# Patient Record
Sex: Male | Born: 1960 | Race: Asian | Hispanic: No | Marital: Married | Smoking: Current every day smoker
Health system: Southern US, Community
[De-identification: ages and names within clinical notes are randomized; demographics above are authoritative.]

## PROBLEM LIST (undated history)

## (undated) DIAGNOSIS — K649 Unspecified hemorrhoids: Secondary | ICD-10-CM

## (undated) DIAGNOSIS — E785 Hyperlipidemia, unspecified: Secondary | ICD-10-CM

## (undated) DIAGNOSIS — K219 Gastro-esophageal reflux disease without esophagitis: Secondary | ICD-10-CM

## (undated) HISTORY — DX: Unspecified hemorrhoids: K64.9

## (undated) HISTORY — DX: Hyperlipidemia, unspecified: E78.5

## (undated) HISTORY — DX: Gastro-esophageal reflux disease without esophagitis: K21.9

---

## 2005-09-25 ENCOUNTER — Emergency Department (HOSPITAL_COMMUNITY): Admission: EM | Admit: 2005-09-25 | Discharge: 2005-09-25 | Payer: Self-pay | Admitting: Emergency Medicine

## 2011-06-24 IMAGING — CR DG HAND COMPLETE 3+V*L*
3 series · 3 of 3 positions shown · non-contrast
Comparison: None.

CLINICAL DATA: Pain of the long finger.  No known injury.

LEFT HAND - COMPLETE 3+ VIEW

[PA]
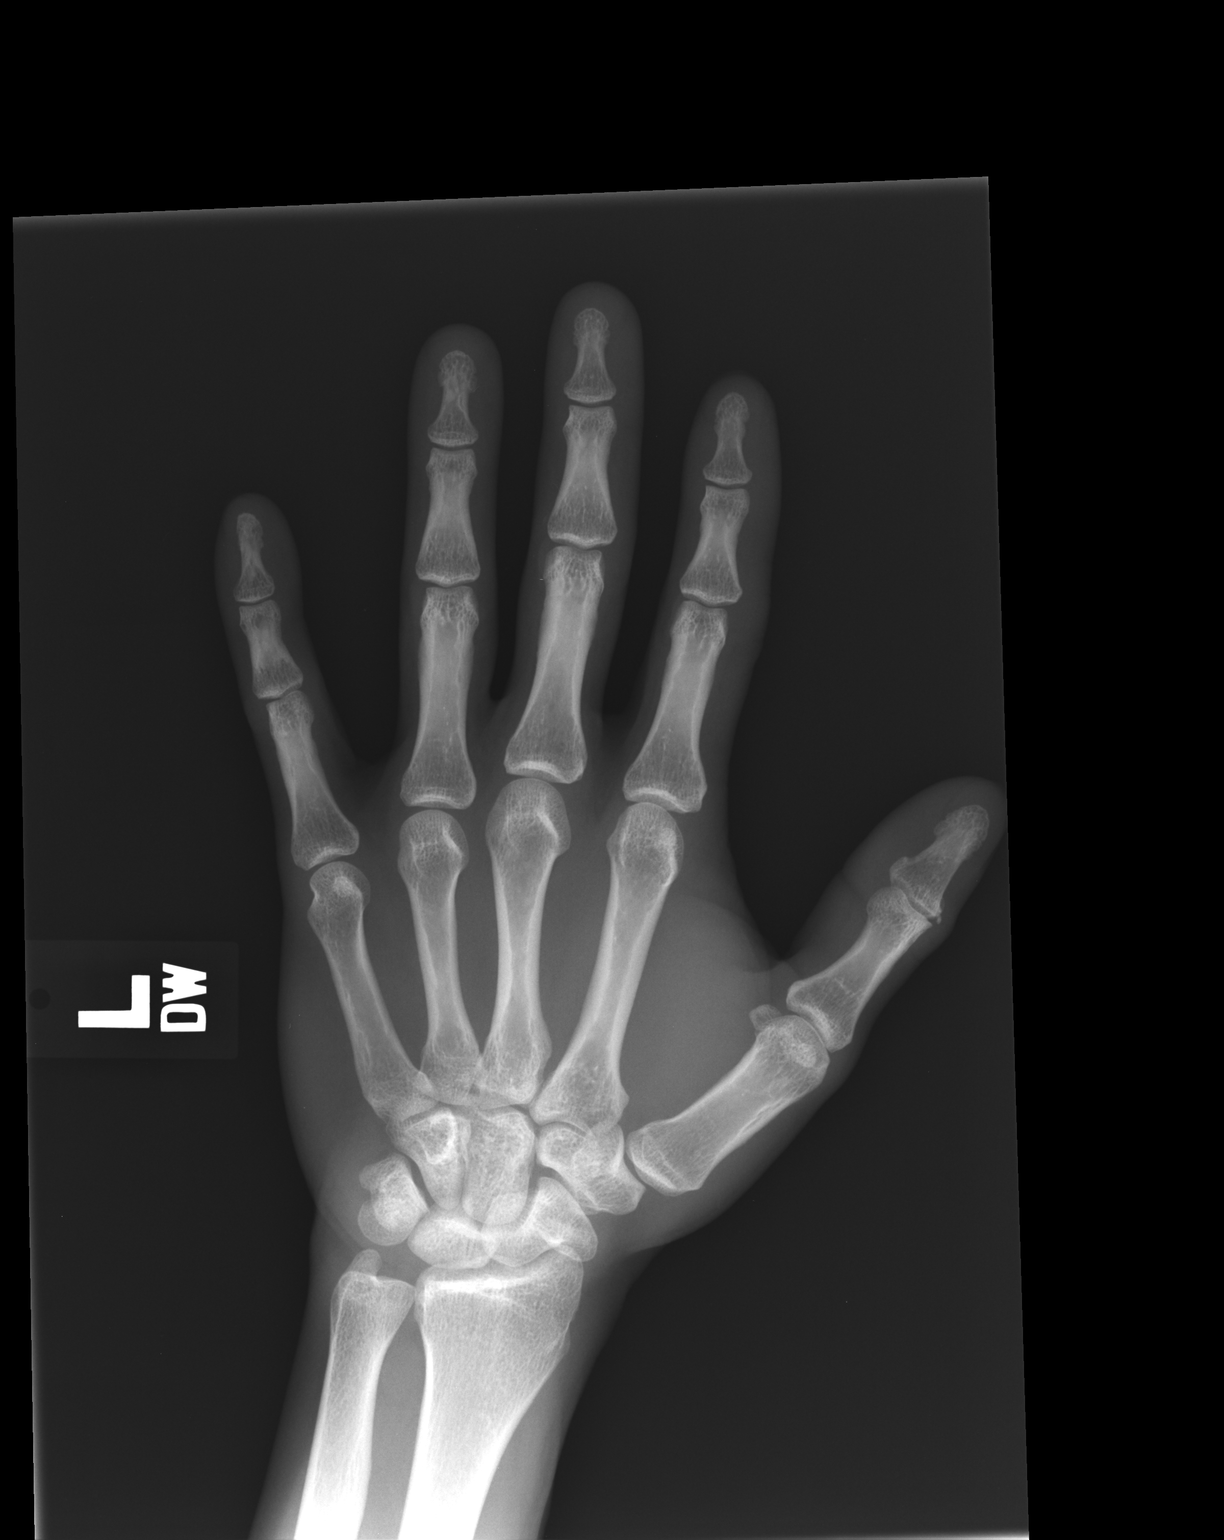

[lateral]
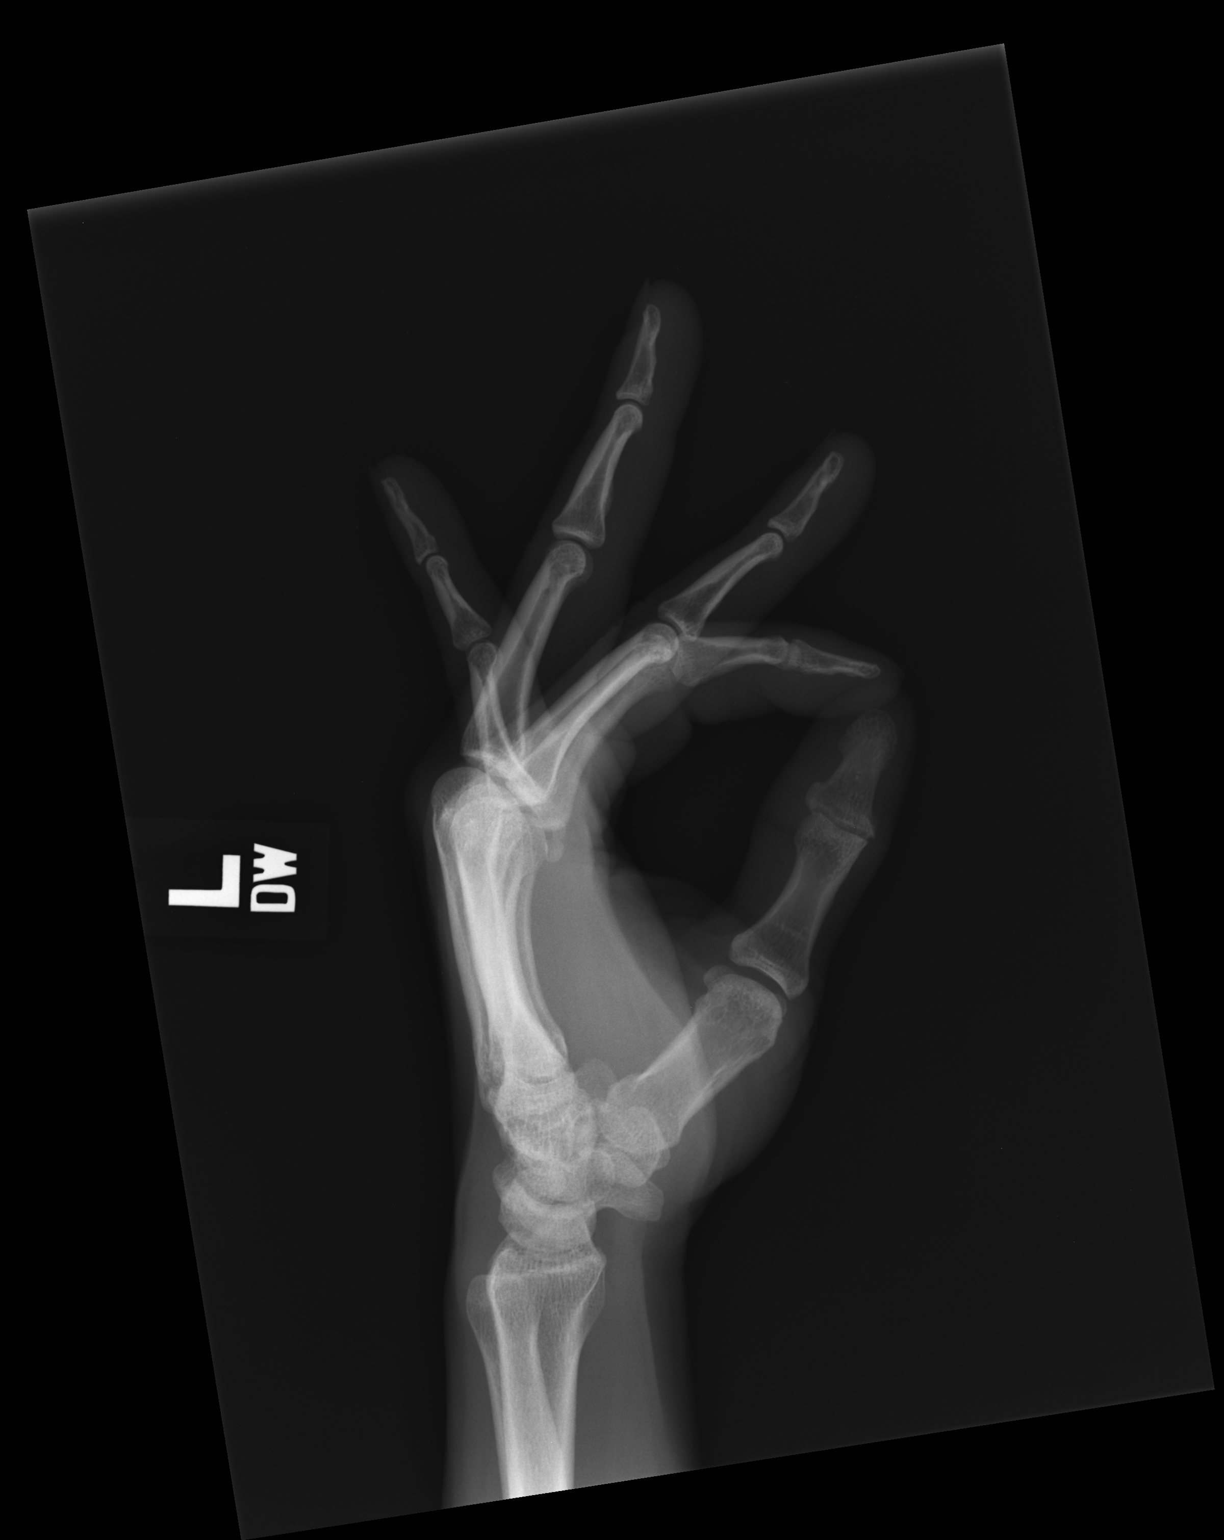

[pa obl]
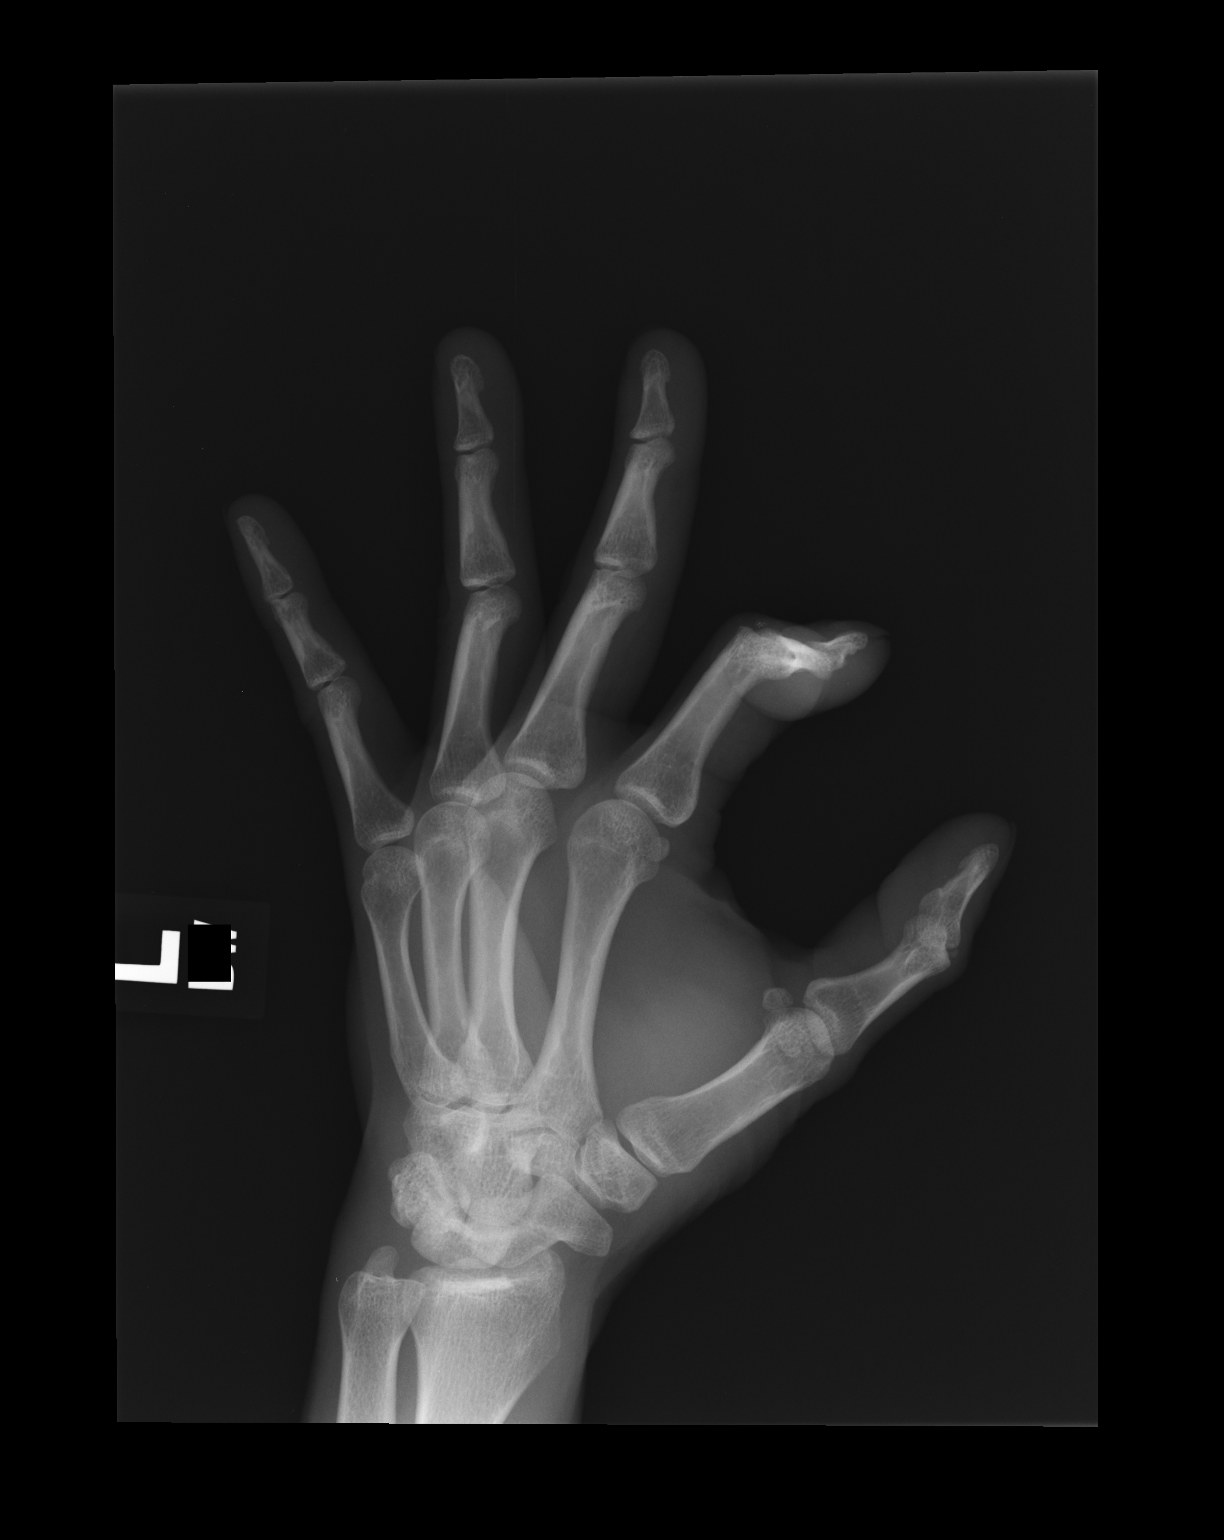

[3 of 3 positions shown; findings below may reference images not displayed]

FINDINGS: Imaged bones, joints and soft tissues appear normal.
IMPRESSION: Negative exam.

Clinically significant discrepancy from primary report, if
provided: None

## 2011-08-29 ENCOUNTER — Ambulatory Visit: Payer: Self-pay | Admitting: Internal Medicine

## 2011-08-29 ENCOUNTER — Ambulatory Visit: Payer: Self-pay

## 2011-08-29 VITALS — BP 103/67 | HR 93 | Temp 97.9°F | Resp 16 | Ht 61.5 in | Wt 104.2 lb

## 2011-08-29 DIAGNOSIS — S6990XA Unspecified injury of unspecified wrist, hand and finger(s), initial encounter: Secondary | ICD-10-CM

## 2011-08-29 DIAGNOSIS — S66819A Strain of other specified muscles, fascia and tendons at wrist and hand level, unspecified hand, initial encounter: Secondary | ICD-10-CM

## 2011-08-29 DIAGNOSIS — S60519A Abrasion of unspecified hand, initial encounter: Secondary | ICD-10-CM

## 2011-08-29 DIAGNOSIS — M6530 Trigger finger, unspecified finger: Secondary | ICD-10-CM

## 2011-08-29 DIAGNOSIS — M79609 Pain in unspecified limb: Secondary | ICD-10-CM

## 2011-08-29 MED ORDER — TRAMADOL HCL 50 MG PO TABS: 50.0000 mg | ORAL_TABLET | Freq: Three times a day (TID) | ORAL | Status: AC | PRN

## 2011-08-29 NOTE — Progress Notes (Signed)
  Subjective:    Patient ID: Jack Moss, male    DOB: 1960-05-22, 51 y.o.   MRN: [id number]  HPI Pain and deformity of the 3rd finger of the left hand. Pain 8/10. Noticed 3 or 4 weeks ago that he had trouble extending the 3rd digit of the left hand after he flexes that finger.  Pain in the hand and in the finger.  No trauma.  Works in a [employer].    Review of Systems  Constitutional: Negative.   HENT: Negative.   Eyes: Negative.   Respiratory: Negative.   Cardiovascular: Negative.   Gastrointestinal: Negative.   Genitourinary: Negative.   Musculoskeletal: Negative.        Pain 3rd digit in the left hand  Skin: Negative.   Neurological: Negative.   Hematological: Negative.   Psychiatric/Behavioral: Negative.   All other systems reviewed and are negative.       Objective:   Physical Exam  Constitutional: He is oriented to person, place, and time. He appears well-developed and well-nourished.       Wife is interpretor  HENT:  Head: Normocephalic and atraumatic.  Eyes: Conjunctivae and EOM are normal. Pupils are equal, round, and reactive to light.  Neck: Normal range of motion. Neck supple.  Musculoskeletal:       3rd digit is held in flexion. No passive extension. Able to extend on active extension. Tender on the palmer surface at the flexor tendon area.  Neurological: He is alert and oriented to person, place, and time.  Skin: Skin is warm and dry.  Psychiatric: He has a normal mood and affect. His behavior is normal. Judgment and thought content normal.     [facility] reading (PRIMARY) by  Dr. Mindi Junker.  negitive hand fracture    Assessment & Plan:  Trigger finger Flexor tendon injury Will put on nsai and put in splint and refer to  Hand surgery.

## 2011-08-31 ENCOUNTER — Telehealth: Payer: Self-pay

## 2020-11-25 ENCOUNTER — Encounter: Payer: Self-pay | Admitting: Family Medicine

## 2020-11-25 ENCOUNTER — Emergency Department (HOSPITAL_COMMUNITY): Payer: 59

## 2020-11-25 ENCOUNTER — Emergency Department (HOSPITAL_COMMUNITY)
Admission: EM | Admit: 2020-11-25 | Discharge: 2020-11-25 | Disposition: A | Discharge status: Home / Self Care | Payer: 59 | Attending: Emergency Medicine | Admitting: Emergency Medicine

## 2020-11-25 ENCOUNTER — Encounter (HOSPITAL_COMMUNITY): Payer: Self-pay | Admitting: Emergency Medicine

## 2020-11-25 ENCOUNTER — Ambulatory Visit (INDEPENDENT_AMBULATORY_CARE_PROVIDER_SITE_OTHER): Payer: 59 | Admitting: Family Medicine

## 2020-11-25 ENCOUNTER — Other Ambulatory Visit: Payer: Self-pay

## 2020-11-25 VITALS — BP 100/58 | HR 75 | Ht 62.0 in | Wt 113.0 lb

## 2020-11-25 DIAGNOSIS — R0789 Other chest pain: Secondary | ICD-10-CM | POA: Diagnosis present

## 2020-11-25 DIAGNOSIS — K8689 Other specified diseases of pancreas: Secondary | ICD-10-CM | POA: Diagnosis not present

## 2020-11-25 DIAGNOSIS — R1084 Generalized abdominal pain: Secondary | ICD-10-CM | POA: Insufficient documentation

## 2020-11-25 DIAGNOSIS — R079 Chest pain, unspecified: Secondary | ICD-10-CM | POA: Insufficient documentation

## 2020-11-25 DIAGNOSIS — F17200 Nicotine dependence, unspecified, uncomplicated: Secondary | ICD-10-CM | POA: Diagnosis not present

## 2020-11-25 DIAGNOSIS — L819 Disorder of pigmentation, unspecified: Secondary | ICD-10-CM | POA: Insufficient documentation

## 2020-11-25 LAB — CBC
HCT: 40.1 % (ref 39.0–52.0)
Hemoglobin: 13.5 g/dL (ref 13.0–17.0)
MCH: 31.1 pg (ref 26.0–34.0)
MCHC: 33.7 g/dL (ref 30.0–36.0)
MCV: 92.4 fL (ref 80.0–100.0)
Platelets: 345 10*3/uL (ref 150–400)
RBC: 4.34 MIL/uL (ref 4.22–5.81)
RDW: 13.2 % (ref 11.5–15.5)
WBC: 5.5 10*3/uL (ref 4.0–10.5)
nRBC: 0 % (ref 0.0–0.2)

## 2020-11-25 LAB — COMPREHENSIVE METABOLIC PANEL
ALT: 31 U/L (ref 0–44)
AST: 26 U/L (ref 15–41)
Albumin: 3.8 g/dL (ref 3.5–5.0)
Alkaline Phosphatase: 41 U/L (ref 38–126)
Anion gap: 6 (ref 5–15)
BUN: 15 mg/dL (ref 6–20)
CO2: 25 mmol/L (ref 22–32)
Calcium: 9.2 mg/dL (ref 8.9–10.3)
Chloride: 107 mmol/L (ref 98–111)
Creatinine, Ser: 0.9 mg/dL (ref 0.61–1.24)
GFR, Estimated: >60 mL/min (ref >60)
Glucose, Bld: 105 mg/dL — ABNORMAL HIGH (ref 70–99)
Potassium: 3.9 mmol/L (ref 3.5–5.1)
Sodium: 138 mmol/L (ref 135–145)
Total Bilirubin: 0.2 mg/dL — ABNORMAL LOW (ref 0.3–1.2)
Total Protein: 6.9 g/dL (ref 6.5–8.1)

## 2020-11-25 LAB — TROPONIN I (HIGH SENSITIVITY)
Troponin I (High Sensitivity): 3 ng/L (ref <18)
Troponin I (High Sensitivity): 3 ng/L (ref <18)

## 2020-11-25 LAB — URINALYSIS, ROUTINE W REFLEX MICROSCOPIC
Bilirubin Urine: NEGATIVE
Glucose, UA: NEGATIVE mg/dL
Hgb urine dipstick: NEGATIVE
Ketones, ur: NEGATIVE mg/dL
Leukocytes,Ua: NEGATIVE
Nitrite: NEGATIVE
Protein, ur: NEGATIVE mg/dL
Specific Gravity, Urine: >1.03 — ABNORMAL HIGH (ref 1.005–1.030)
pH: 6 (ref 5.0–8.0)

## 2020-11-25 LAB — LIPASE, BLOOD: Lipase: 56 U/L — ABNORMAL HIGH (ref 11–51)

## 2020-11-25 IMAGING — CT CT ABD-PELV W/ CM
2 of 7 series · 15 of 46 positions shown, 17 images · IV contrast (APPLIED)
Comparison: None

CLINICAL DATA: Cardiologist up JOSHJAX 0 CT coil thanks MR
given how you I am okay IF add my email off for the

EXAM:
CT ABDOMEN AND PELVIS WITH CONTRAST
TECHNIQUE: Multidetector CT imaging of the abdomen and pelvis was performed
using the standard protocol following bolus administration of
intravenous contrast.
CONTRAST:  100mL OMNIPAQUE IOHEXOL 300 MG/ML  SOLN

[Series 6: coronal soft tissue · coronal · 0.62mm/px · 3 of 79 slices shown]
[im 27/79  soft-tissue]
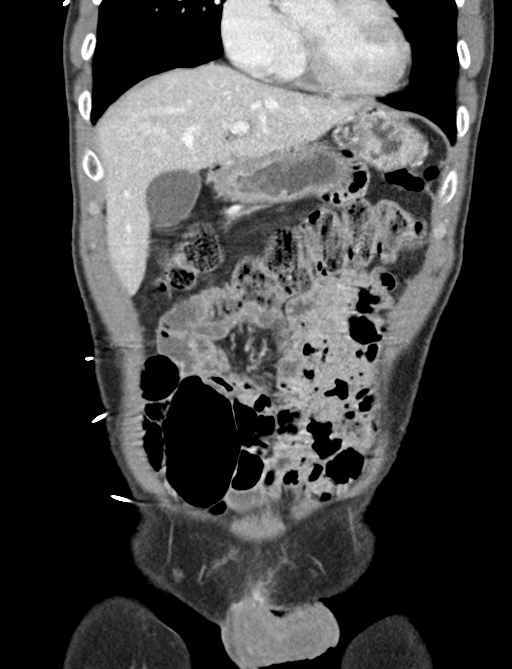
[im 35/79  soft-tissue]
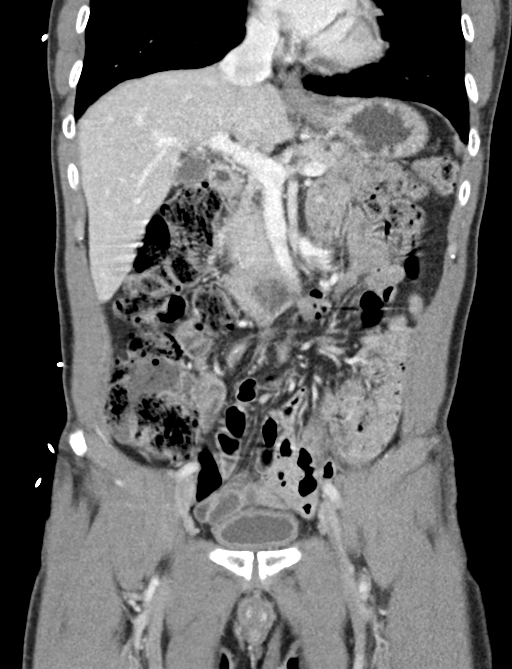
[im 44/79  soft-tissue]
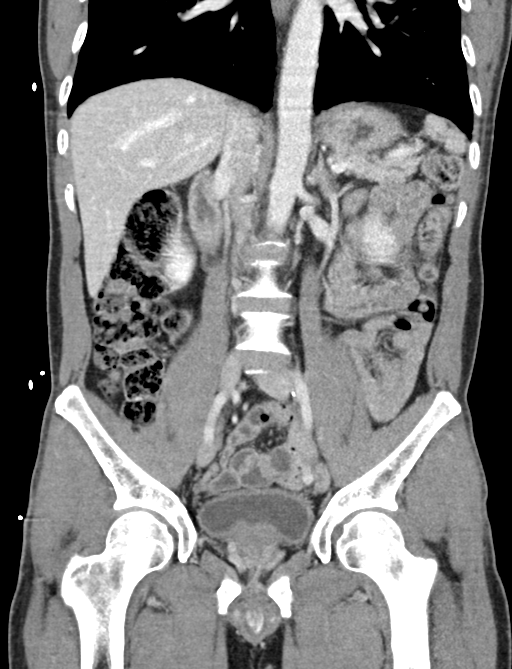

[Series 9: abd/ pelvis 1.0 i30f 2 · axial · 0.64mm/px · z∈[+825,+1203]mm · 12 of 414 slices shown, 14 images]
[im 18/414  soft-tissue]
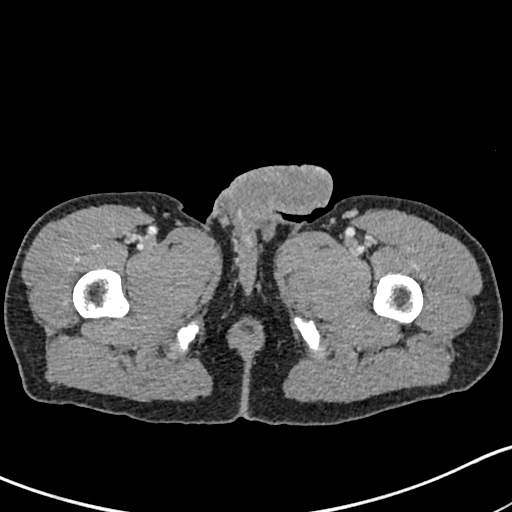
[im 18/414  bone]
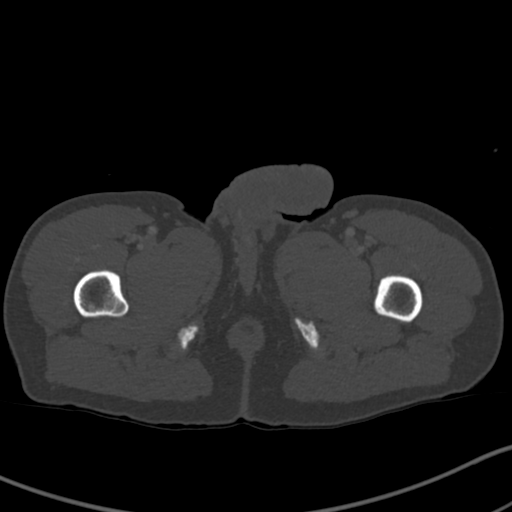
[im 54/414  soft-tissue]
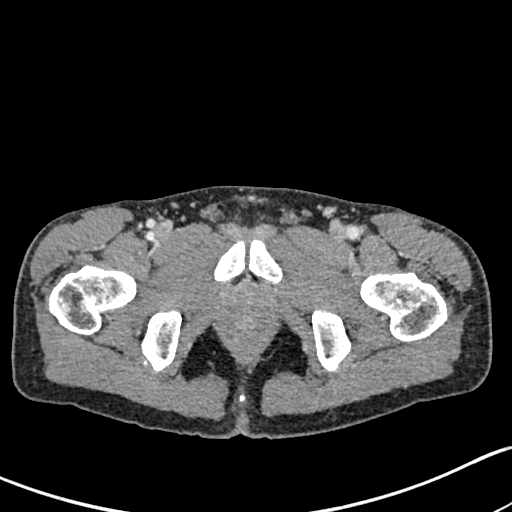
[im 90/414  soft-tissue]
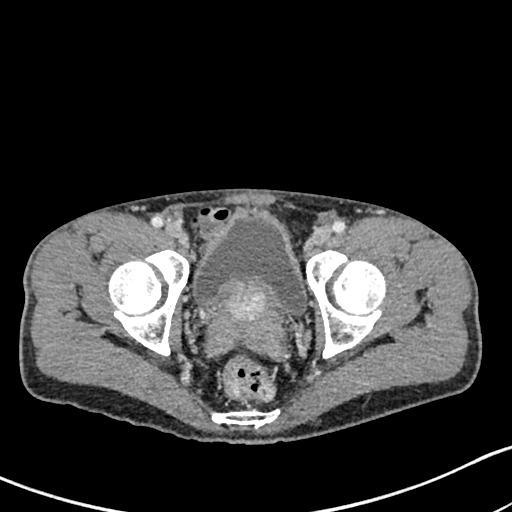
[im 126/414  soft-tissue]
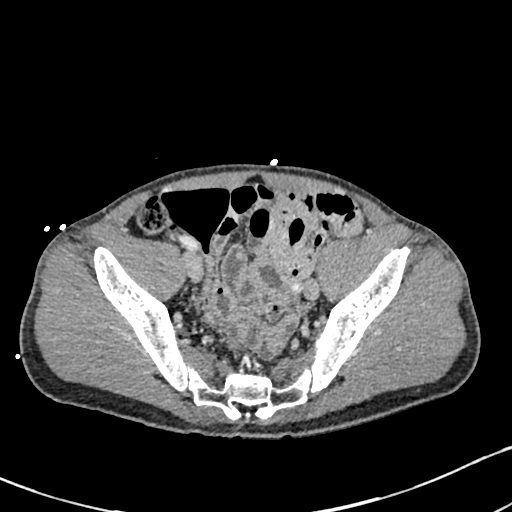
[im 162/414  soft-tissue]
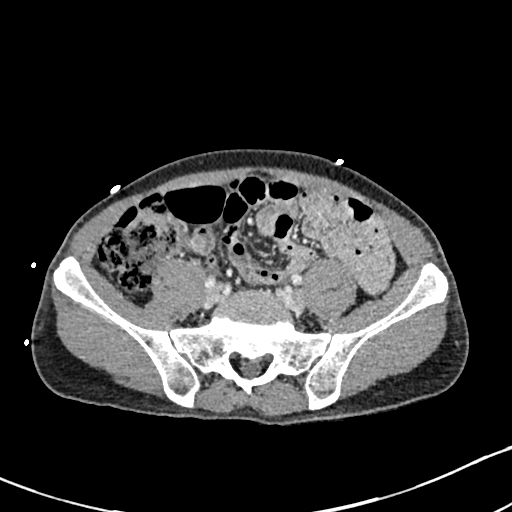
[im 198/414  soft-tissue]
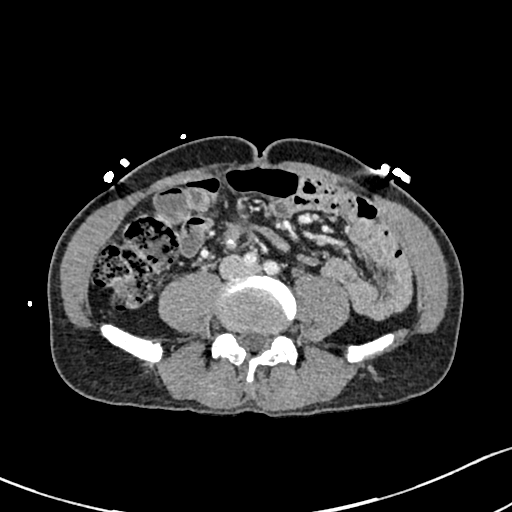
[im 216/414  soft-tissue]
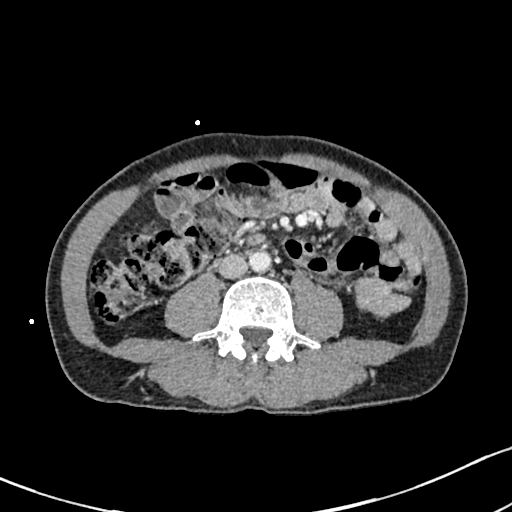
[im 252/414  soft-tissue]
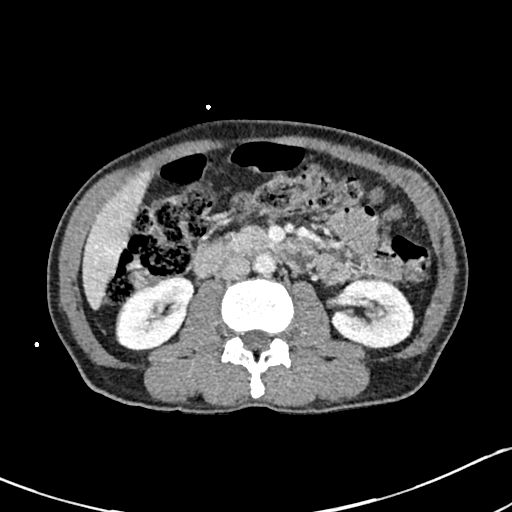
[im 288/414  soft-tissue]
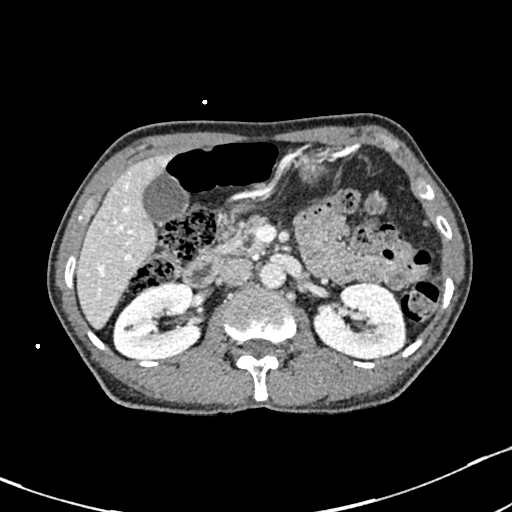
[im 288/414  bone]
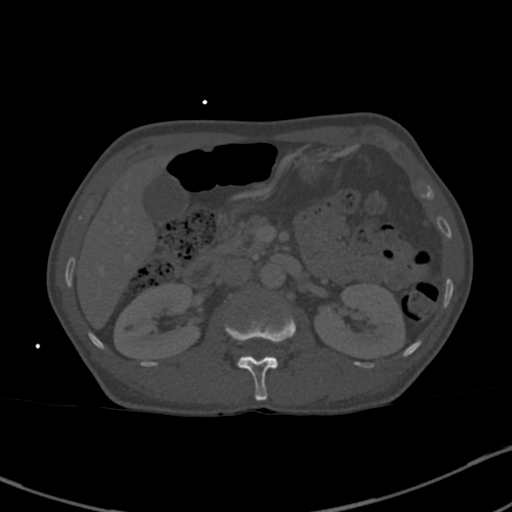
[im 324/414  soft-tissue]
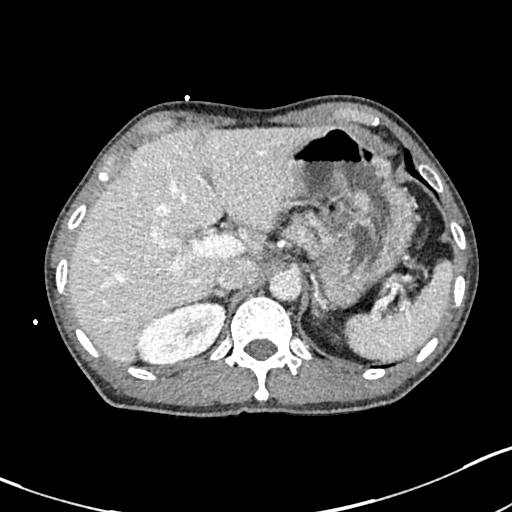
[im 360/414  soft-tissue]
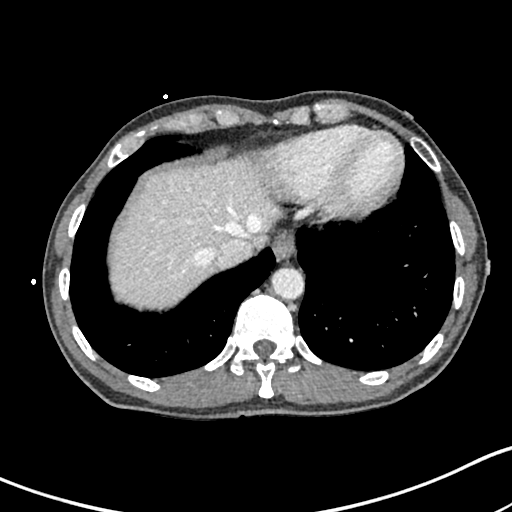
[im 396/414  soft-tissue]
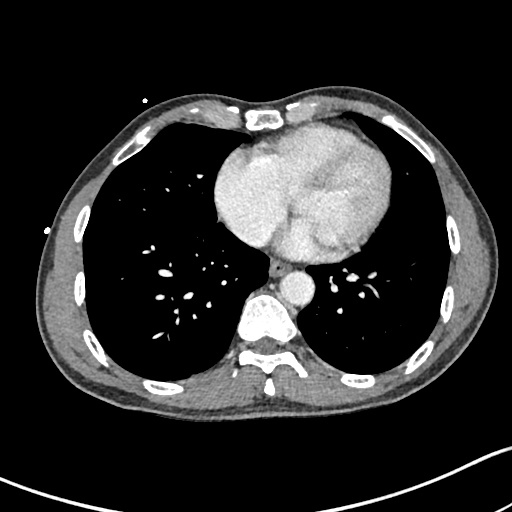

[15 of 46 positions shown; findings below may reference images not displayed]

FINDINGS: Lower chest: Unremarkable.

Hepatobiliary: Tiny hypodensity in the dome of the RIGHT hemi liver
and in the RIGHT hemi liver in hepatic subsegment VI likely small
cyst. Portal vein is patent. Hepatic veins are patent. No
pericholecystic stranding. No biliary duct dilation.

Pancreas: Signs of isolated pancreatic ductal dilation without
visible lesion. Main duct up to 5 mm caliber. No substantial atrophy
of peripheral pancreas. No signs of active inflammation at this
time.

Spleen: Normal size and contour.

Adrenals/Urinary Tract: Adrenal glands are normal.

Symmetric renal enhancement. No hydronephrosis. No nephrolithiasis.
Small renal lesions likely small cyst too small for definitive
characterization. Urinary bladder with smooth contours.

No perivesical stranding.

Stomach/Bowel: Normal appendix. No sign of small bowel obstruction
or acute small bowel process. Colon is largely stool filled
proximally with mild collapsed distally in no signs of obstruction
or pericolonic stranding. Mid transverse colon crossing the midline
transitioning in the sigmoid colon higher than expected passing
posterior to small bowel loops, no sign of obstruction or stranding.

Vascular/Lymphatic: Calcified atheromatous plaque of the abdominal
aorta without aneurysmal dilation. Abdominal vasculature is patent.
There is no gastrohepatic or hepatoduodenal ligament
lymphadenopathy. No retroperitoneal or mesenteric lymphadenopathy.

Reproductive: No pelvic lymphadenopathy. Enlarged prostate, mildly
heterogeneous, nonspecific on CT.

Other: No ascites.  No free air.

Musculoskeletal: No acute bone finding. No destructive bone process.
IMPRESSION: 1. No acute findings in the abdomen or pelvis.
2. Signs of isolated pancreatic ductal dilation without visible
lesion. No substantial atrophy of peripheral pancreas. Findings may
be related to prior pancreatitis, given degree of pancreatic ductal
distension consider follow-up MRI MRCP, preferably outside of the
acute setting when the patient is better able to hold their breath
and remain still during the evaluation. This will exclude the
possibility of developing intraductal papillary mucinous neoplasm of
the main duct or occult lesion with obstruction.
3. Unusual course of the sigmoid colon passing behind the small
bowel mesentery in the central abdomen. No frank twist or stranding.
No signs of obstruction. This may be due to variant anatomy. If pain
is colicky or recurrent could consider a follow-up evaluation with
positive oral contrast. There is no stranding, obstruction or
thickening to suggest acute process at this time.
4. Aortic atherosclerosis.

## 2020-11-25 IMAGING — CR DG CHEST 2V
2 series · 2 of 2 positions shown · non-contrast
Comparison: None.

CLINICAL DATA: Chest pain worsening today

EXAM:
CHEST - 2 VIEW

[chest pa]
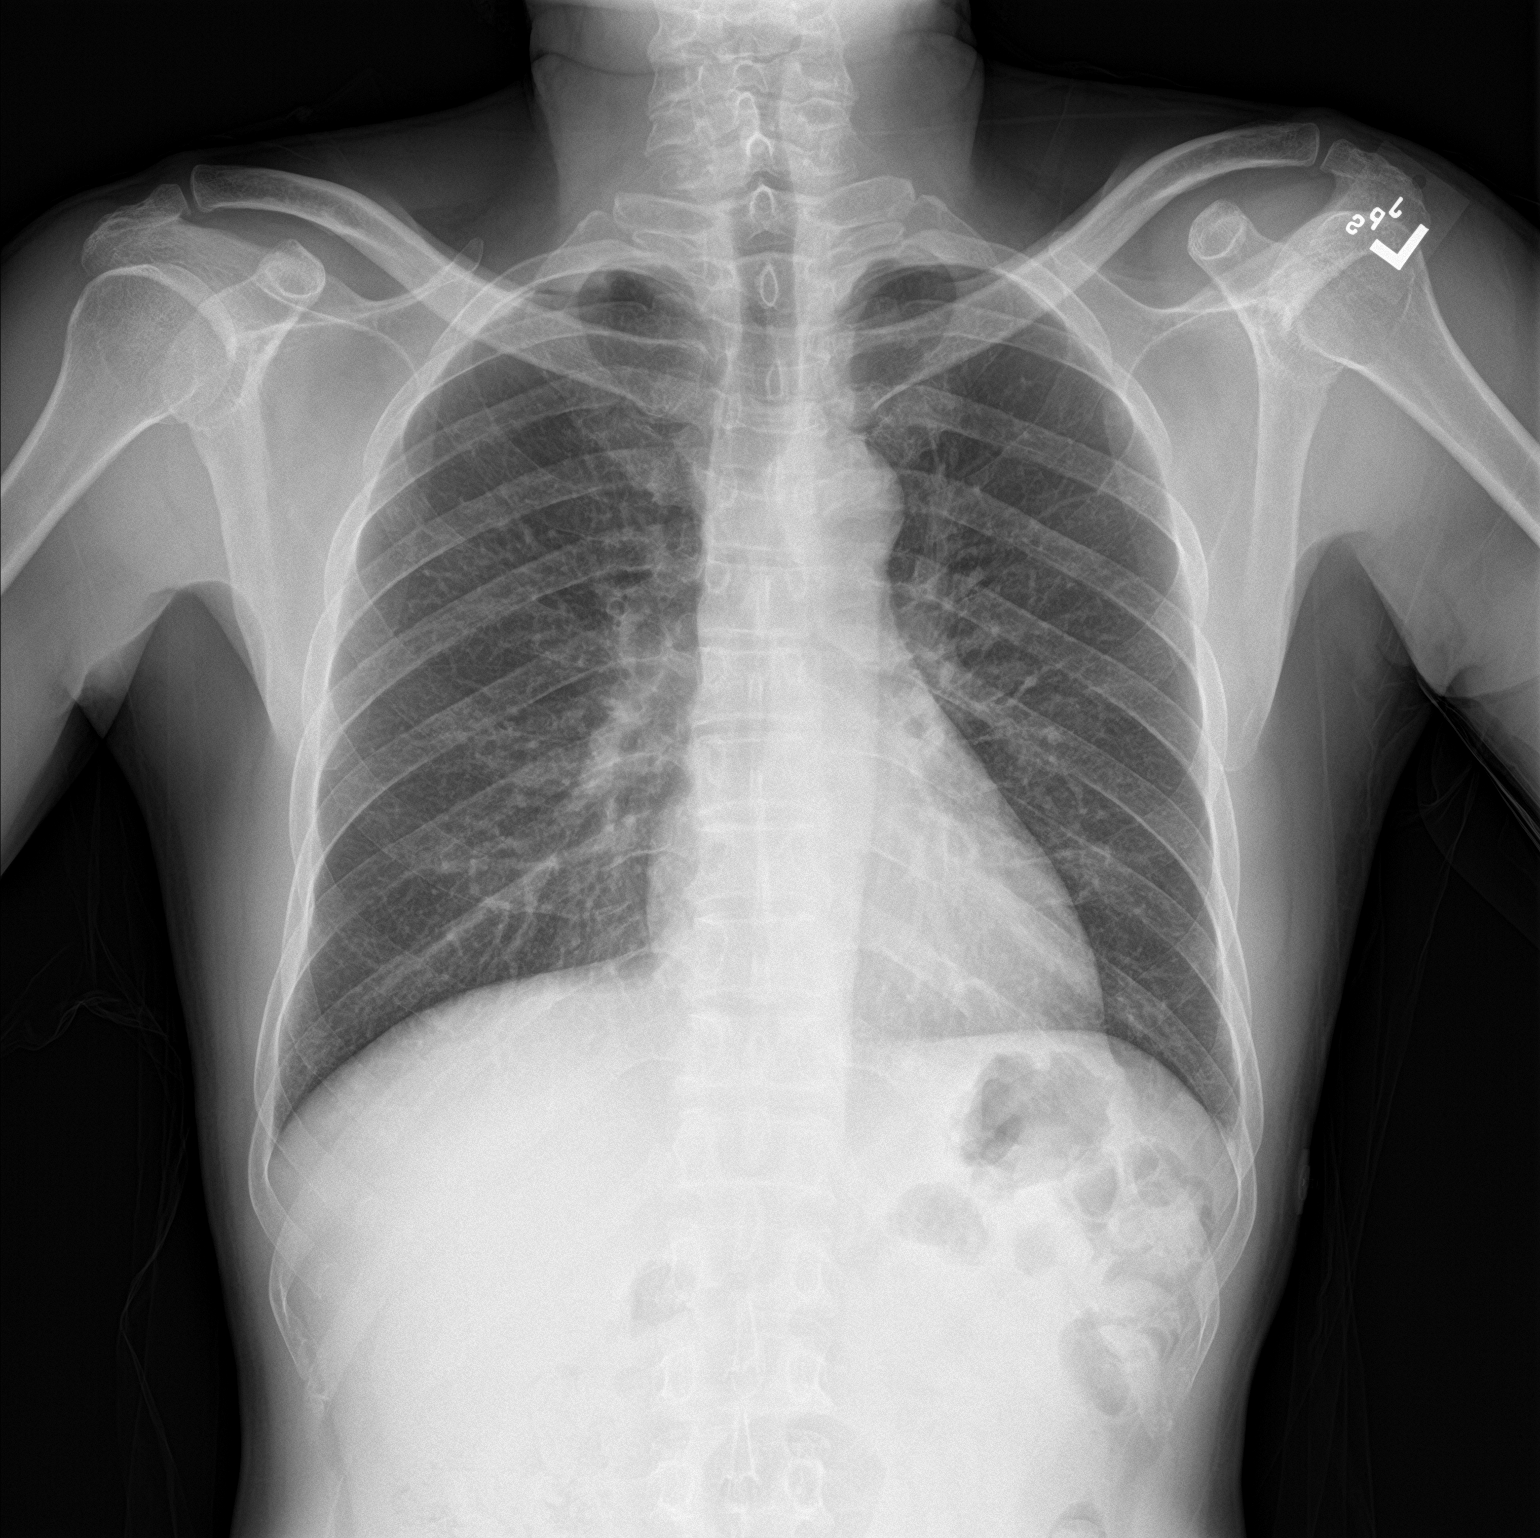

[chest lat]
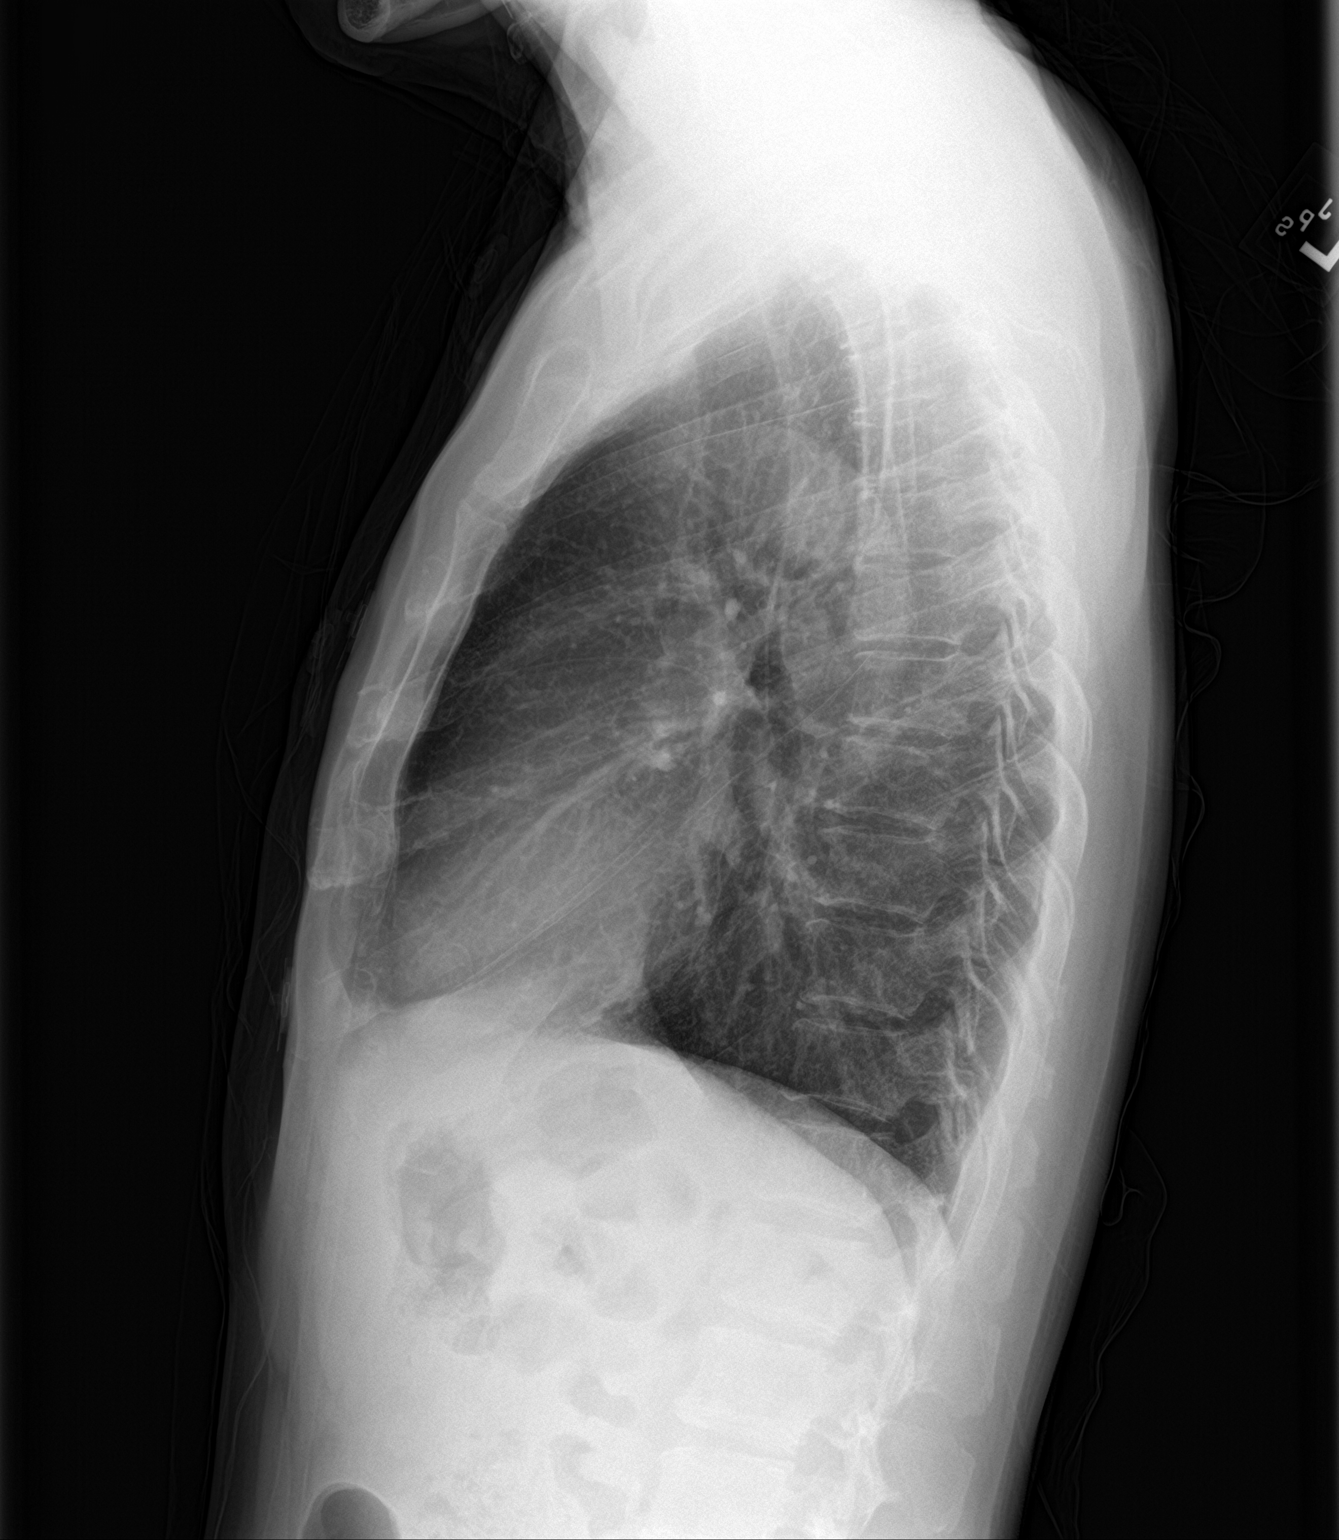

[2 of 2 positions shown; findings below may reference images not displayed]

FINDINGS: The heart size and mediastinal contours are within normal limits.
Both lungs are clear. The visualized skeletal structures are
unremarkable.
IMPRESSION: No active cardiopulmonary disease.

## 2020-11-25 MED ORDER — PANTOPRAZOLE SODIUM 20 MG PO TBEC: 20.0000 mg | DELAYED_RELEASE_TABLET | Freq: Every day | ORAL | 0 refills | Status: DC

## 2020-11-25 MED ORDER — IOHEXOL 300 MG/ML  SOLN
100.0000 mL | Freq: Once | INTRAMUSCULAR | Status: AC | PRN
  Administered 2020-11-25: 100 mL via INTRAVENOUS

## 2020-11-25 MED ORDER — LIDOCAINE VISCOUS HCL 2 % MT SOLN
15.0000 mL | Freq: Once | OROMUCOSAL | Status: AC
  Administered 2020-11-25: 15 mL via ORAL
  Filled 2020-11-25: qty 15

## 2020-11-25 MED ORDER — ALUM & MAG HYDROXIDE-SIMETH 200-200-20 MG/5ML PO SUSP
30.0000 mL | Freq: Once | ORAL | Status: AC
  Administered 2020-11-25: 30 mL via ORAL
  Filled 2020-11-25: qty 30

## 2020-11-25 NOTE — ED Provider Notes (Signed)
[facility] EMERGENCY DEPARTMENT Provider Note   CSN: [id number] Arrival date & time: 11/25/20  1030     History Chief Complaint  Patient presents with   Chest Pain    Jack Moss is a 60 y.o. male presenting for evaluation of chest and abd pain.   History obtained with interpreter service, as pt speaks [place].   Pt states his sxs began a few yrs ago. They are continuing to worsen each day. His pain is now constant. Nothing makes it worse, it improves with honey in his drinks. He describes it as a dull ache, but later states it feels like a pressure/tightness. He has not taken any medicine for this. He recently got insurance, and as such saw a PCP today, who recommended ER evaluation. He denies fever, cough, sob, pleuritic pain, urinary sxs, or abnormal BMs.  He denies weight changes.   HPI     History reviewed. No pertinent past medical history.  Patient Active Problem List   Diagnosis Date Noted   Chest pain 11/25/2020   Hyperpigmented skin lesion 11/25/2020    No past surgical history on file.     No family history on file.  Social History   Tobacco Use   Smoking status: Every Day   Smokeless tobacco: Never    Home Medications Prior to Admission medications   Medication Sig Start Date End Date Taking? Authorizing Provider  pantoprazole (PROTONIX) 20 MG tablet Take 1 tablet (20 mg total) by mouth daily. 11/25/20  Yes Lorence Nagengast, PA-C    Allergies    Patient has no known allergies.  Review of Systems   Review of Systems  Cardiovascular:  Positive for chest pain.  Gastrointestinal:  Positive for abdominal pain.  All other systems reviewed and are negative.  Physical Exam Updated Vital Signs BP 101/73   Pulse (!) 54   Temp 98.4 F (36.9 C) (Oral)   Resp 14   SpO2 99%   Physical Exam Vitals and nursing note reviewed.  Constitutional:      General: He is not in acute distress.    Appearance: Normal appearance.  HENT:      Head: Normocephalic and atraumatic.  Eyes:     Conjunctiva/sclera: Conjunctivae normal.     Pupils: Pupils are equal, round, and reactive to light.  Cardiovascular:     Rate and Rhythm: Normal rate and regular rhythm.     Pulses: Normal pulses.  Pulmonary:     Effort: Pulmonary effort is normal. No respiratory distress.     Breath sounds: Normal breath sounds. No wheezing.     Comments: Speaking in full sentences.  Clear lung sounds in all fields. Abdominal:     General: There is no distension.     Palpations: Abdomen is soft. There is no mass.     Tenderness: There is abdominal tenderness. There is no guarding or rebound.     Comments: Mild ttp of the abd diffusely. No murphys sign  Musculoskeletal:        General: Normal range of motion.     Cervical back: Normal range of motion and neck supple.  Skin:    General: Skin is warm and dry.     Capillary Refill: Capillary refill takes less than 2 seconds.  Neurological:     Mental Status: He is alert and oriented to person, place, and time.  Psychiatric:        Mood and Affect: Mood and affect normal.  Speech: Speech normal.        Behavior: Behavior normal.    ED Results / Procedures / Treatments   Labs (all labs ordered are listed, but only abnormal results are displayed) Labs Reviewed  COMPREHENSIVE METABOLIC PANEL - Abnormal; Notable for the following components:      Result Value   Glucose, Bld 105 (*)    Total Bilirubin 0.2 (*)    All other components within normal limits  URINALYSIS, ROUTINE W REFLEX MICROSCOPIC - Abnormal; Notable for the following components:   Specific Gravity, Urine >1.030 (*)    All other components within normal limits  LIPASE, BLOOD - Abnormal; Notable for the following components:   Lipase 56 (*)    All other components within normal limits  CBC  TROPONIN I (HIGH SENSITIVITY)  TROPONIN I (HIGH SENSITIVITY)    EKG EKG Interpretation  Date/Time:  Thursday November 25 2020 10:50:34  EDT Ventricular Rate:  75 PR Interval:  180 QRS Duration: 90 QT Interval:  384 QTC Calculation: 428 R Axis:   94 Text Interpretation: Normal sinus rhythm Right atrial enlargement Rightward axis Pulmonary disease pattern Abnormal ECG No significant change since last tracing Confirmed by Isla Pence [phone]) on 11/25/2020 11:47:07 AM  Radiology DG Chest 2 View  Result Date: 11/25/2020 CLINICAL DATA:  Chest pain worsening today EXAM: CHEST - 2 VIEW COMPARISON:  None. FINDINGS: The heart size and mediastinal contours are within normal limits. Both lungs are clear. The visualized skeletal structures are unremarkable. IMPRESSION: No active cardiopulmonary disease. Electronically Signed   By: Van Clines M.D.   On: 11/25/2020 11:22   CT ABDOMEN PELVIS W CONTRAST  Result Date: 11/25/2020 CLINICAL DATA:  Cardiologist up a [place] 0 CT coil thanks MR given how you I am okay IF add my email off for the EXAM: CT ABDOMEN AND PELVIS WITH CONTRAST TECHNIQUE: Multidetector CT imaging of the abdomen and pelvis was performed using the standard protocol following bolus administration of intravenous contrast. CONTRAST:  126mL OMNIPAQUE IOHEXOL 300 MG/ML  SOLN COMPARISON:  None FINDINGS: Lower chest: Unremarkable. Hepatobiliary: Tiny hypodensity in the dome of the RIGHT hemi liver and in the RIGHT hemi liver in hepatic subsegment VI likely small cyst. Portal vein is patent. Hepatic veins are patent. No pericholecystic stranding. No biliary duct dilation. Pancreas: Signs of isolated pancreatic ductal dilation without visible lesion. Main duct up to 5 mm caliber. No substantial atrophy of peripheral pancreas. No signs of active inflammation at this time. Spleen: Normal size and contour. Adrenals/Urinary Tract: Adrenal glands are normal. Symmetric renal enhancement. No hydronephrosis. No nephrolithiasis. Small renal lesions likely small cyst too small for definitive characterization. Urinary bladder with smooth  contours. No perivesical stranding. Stomach/Bowel: Normal appendix. No sign of small bowel obstruction or acute small bowel process. Colon is largely stool filled proximally with mild collapsed distally in no signs of obstruction or pericolonic stranding. Mid transverse colon crossing the midline transitioning in the sigmoid colon higher than expected passing posterior to small bowel loops, no sign of obstruction or stranding. Vascular/Lymphatic: Calcified atheromatous plaque of the abdominal aorta without aneurysmal dilation. Abdominal vasculature is patent. There is no gastrohepatic or hepatoduodenal ligament lymphadenopathy. No retroperitoneal or mesenteric lymphadenopathy. Reproductive: No pelvic lymphadenopathy. Enlarged prostate, mildly heterogeneous, nonspecific on CT. Other: No ascites.  No free air. Musculoskeletal: No acute bone finding. No destructive bone process. IMPRESSION: 1. No acute findings in the abdomen or pelvis. 2. Signs of isolated pancreatic ductal dilation without visible lesion. No  substantial atrophy of peripheral pancreas. Findings may be related to prior pancreatitis, given degree of pancreatic ductal distension consider follow-up MRI MRCP, preferably outside of the acute setting when the patient is better able to hold their breath and remain still during the evaluation. This will exclude the possibility of developing intraductal papillary mucinous neoplasm of the main duct or occult lesion with obstruction. 3. Unusual course of the sigmoid colon passing behind the small bowel mesentery in the central abdomen. No frank twist or stranding. No signs of obstruction. This may be due to variant anatomy. If pain is colicky or recurrent could consider a follow-up evaluation with positive oral contrast. There is no stranding, obstruction or thickening to suggest acute process at this time. 4. Aortic atherosclerosis. Electronically Signed   By: Zetta Bills M.D.   On: 11/25/2020 15:56     Procedures Procedures   Medications Ordered in ED Medications  alum & mag hydroxide-simeth (MAALOX/MYLANTA) 200-200-20 MG/5ML suspension 30 mL (30 mLs Oral Given 11/25/20 1243)    And  lidocaine (XYLOCAINE) 2 % viscous mouth solution 15 mL (15 mLs Oral Given 11/25/20 1243)  iohexol (OMNIPAQUE) 300 MG/ML solution 100 mL (100 mLs Intravenous Contrast Given 11/25/20 1418)    ED Course  I have reviewed the triage vital signs and the nursing notes.  Pertinent labs & imaging results that were available during my care of the patient were reviewed by me and considered in my medical decision making (see chart for details).    MDM Rules/Calculators/A&P                          Pt presenting for evaluation of CP and abd pain. On exam, pt appears nontoxic. Mild diffuse ttp of the abd. No labs obtained from triage interpreted by me, overall reassuring., no leukocytosis. Kidney and liver fxn normal. Lipase very mildly elevated, however not significantly. Doubt acute pancreatitis considering length of sxs. CT ordered for further evaluation.   CXR viewed and independently interpreted by me, no pneumonia pneumothorax or effusion.  Troponin negative x2.  Doubt cardiac cause.  CT without acute findings, however abnormality of the pancreatic duct. Will have pt f/u with GI. On reassessment, pt states sxs are completely resolved with GI cocktail. Will dx with Protonix for sx control. . At this time, pt appears safe for d/c return precautions given. Pt states he understands and agrees to plan.   Final Clinical Impression(s) / ED Diagnoses Final diagnoses:  Atypical chest pain  Generalized abdominal pain  Pancreatic duct dilated    Rx / DC Orders ED Discharge Orders          Ordered    pantoprazole (PROTONIX) 20 MG tablet  Daily        11/25/20 1631             Franchot Heidelberg, PA-C 11/25/20 1635    Isla Pence, MD 11/26/20 [phone]

## 2020-11-25 NOTE — Assessment & Plan Note (Signed)
Patient's primary concern today is his chest pain.  There is a language barrier so it is difficult to get a clear picture of how long this is been going on but it sounds like off and on for up to 2 years.  He is complaining of considerable amount of pain today which starts in the middle of his chest and radiates down to his abdomen and then becomes diffuse abdominal pain.  He is shifting his weight throughout the exam because he reports it is bothering him so much.  EKG shows no ST elevations.  Given limited medical history and level of discomfort that the patient is experiencing recommended he go be further evaluated in the emergency department.  This may be cardiac in origin but etiology could also be GI related.  He is walked over by nursing staff.  He will follow-up in our clinic at a later date for further evaluation of other concerns.

## 2020-11-25 NOTE — Patient Instructions (Signed)
It was a pleasure meeting you today.  Given your chest pain I want you evaluated in the emergency department.  We are going to walk you over there.  I know you are concerned about your hemorrhoid and we will address this at a future visit.  You can start taking a medication called MiraLAX which she can get over-the-counter.  You can mix 1 scoop with a cup of water twice daily and this will help soften her stools and make it easier for her to have a bowel movement.  Please go be evaluated and you can call and make a follow-up appointment after you were assessed in the hospital.  I hope you have a wonderful day and I hope everything is okay.

## 2020-11-25 NOTE — Discharge Instructions (Addendum)
Your work up today was overall reassuring.  Your CT did not show any concerning/emergent findings. It did show that your pancreatic duct is dilated.  This can be follow-up outpatient with a GI doctor, Dr. Carlean Purl, listed below. Take Protonix daily to decrease stomach acid to see if this helps with your symptoms. If it is not improving or if your pain returns after finishing the medication, follow-up with your primary care doctor or the GI doctor listed below. Return to the emergency room if you develop high fevers, persistent vomiting, severe worsening pain, any new, worsening, or concerning symptoms.

## 2020-11-25 NOTE — Progress Notes (Signed)
    SUBJECTIVE:   CHIEF COMPLAINT / HPI:   New patient visit   Current concerns  Chest pain that radiates to epigastric area and down into abomen. Been going on for 2 years. Worse with exercise, better with "no movement". No Medications for it.  Patient continues to complain of chest pain radiating from the middle of his chest down to his stomach and then diffuse pain in his abdomen.  Patient also has concerns about a hemorrhoid and would like it surgically removed.  We will address this at a later visit.  PMH  None   PSH  None   Allergies  NKDA  Family Hx  None   Social hx  Lives in [place]. Works at [employer] and in [employer]. Lives with wife and 2 children. Has 3 children. Smokes cigarette- 3 per day smoked for 35 years. Did smoke 5-5 cigarettes daily. Does not drink EtOH. No other drugs. Walks for exercise.   OBJECTIVE:   BP (!) 100/58   Pulse 75   Ht 5\' 2"  (1.575 m)   Wt 113 lb (51.3 kg)   SpO2 98%   BMI 20.67 kg/m   General: Ill-appearing 60 year old male, pale, shifting his weight Cardiac: Regular rate and rhythm, no murmurs appreciated Respiratory: Normal work of breathing, lungs clear to auscultation bilaterally Abdomen: Diffuse tenderness to palpation, positive bowel sounds.  Every time I palpate patient tenses his abdomen, difficult to determine Derm: Patient has multiple areas of hyperpigmentation scattered around arms and legs.  He has healing wounds on arms of his leg as well.  See images below.         ASSESSMENT/PLAN:   Hyperpigmented skin lesion Patient has multiple hyperpigmented skin lesions on arms and legs.  Also has healing wounds.  Concern for porphyria cutanea tarda.  Given patient's chest pain we will work this up at a later date.  Chest pain Patient's primary concern today is his chest pain.  There is a language barrier so it is difficult to get a clear picture of how long this is been going on but it sounds like off and  on for up to 2 years.  He is complaining of considerable amount of pain today which starts in the middle of his chest and radiates down to his abdomen and then becomes diffuse abdominal pain.  He is shifting his weight throughout the exam because he reports it is bothering him so much.  EKG shows no ST elevations.  Given limited medical history and level of discomfort that the patient is experiencing recommended he go be further evaluated in the emergency department.  This may be cardiac in origin but etiology could also be GI related.  He is walked over by nursing staff.  He will follow-up in our clinic at a later date for further evaluation of other concerns.   Gifford Shave, MD [place]

## 2020-11-25 NOTE — Assessment & Plan Note (Signed)
Patient has multiple hyperpigmented skin lesions on arms and legs.  Also has healing wounds.  Concern for porphyria cutanea tarda.  Given patient's chest pain we will work this up at a later date.

## 2020-11-25 NOTE — ED Triage Notes (Signed)
Patient complains of chest pain that started two years ago and got worse today. Pain is exacerbated by inspiration. Patient alert, oriented, ambulatory, and in no apparent distress at this time.

## 2020-12-28 ENCOUNTER — Ambulatory Visit (INDEPENDENT_AMBULATORY_CARE_PROVIDER_SITE_OTHER): Payer: 59 | Admitting: Family Medicine

## 2020-12-28 ENCOUNTER — Other Ambulatory Visit: Payer: Self-pay

## 2020-12-28 VITALS — BP 100/70 | HR 84 | Ht 64.0 in | Wt 112.2 lb

## 2020-12-28 DIAGNOSIS — R079 Chest pain, unspecified: Secondary | ICD-10-CM

## 2020-12-28 DIAGNOSIS — E785 Hyperlipidemia, unspecified: Secondary | ICD-10-CM | POA: Diagnosis not present

## 2020-12-28 DIAGNOSIS — Q453 Other congenital malformations of pancreas and pancreatic duct: Secondary | ICD-10-CM

## 2020-12-28 MED ORDER — ASPIRIN EC 81 MG PO TBEC: 81.0000 mg | DELAYED_RELEASE_TABLET | Freq: Every day | ORAL | 11 refills | Status: DC

## 2020-12-28 MED ORDER — PANTOPRAZOLE SODIUM 20 MG PO TBEC: 20.0000 mg | DELAYED_RELEASE_TABLET | Freq: Every day | ORAL | 0 refills | Status: DC

## 2020-12-28 NOTE — Patient Instructions (Signed)
It was great seeing you today.  I am concerned that your chest tightness is related to issues with your heart.  I have put in a referral for cardiology.  I want you to start taking aspirin 81 mg daily.  I am also going to check your cholesterol.  Regarding the abnormal findings on your CT that you got while you were in the emergency department in July I want you to follow-up with the stomach doctors.  I have placed a referral for this.  If you have any questions or concerns please let us know.  I hope you have a wonderful day!

## 2020-12-28 NOTE — Progress Notes (Signed)
    SUBJECTIVE:   CHIEF COMPLAINT / HPI:   Chest tightness Patient presents with his wife for a follow-up visit from the previous visit.  Of note on the previous visit he was complaining of intense chest pressure which continued to get worse throughout the visit and he was sent to the emergency department.  He had a full work-up which came back negative for any acute cardiac events.  Patient reports that he has been taking his pantoprazole as prescribed and it may have helped "a little" but he is still having issues with this.  He says that the chest pressure occurs when he lifts anything over 100 pounds.  He notices it only when he is at work and it is improved by rest.  He is also complaining of some epigastric pain which comes and goes.  Denies any true chest pain, shortness of breath, nausea, vomiting.  OBJECTIVE:   BP 100/70   Pulse 84   Ht '5\' 4"'$  (1.626 m)   Wt 112 lb 3.2 oz (50.9 kg)   SpO2 99%   BMI 19.26 kg/m   General: Well-appearing 60 year old male.  Appears older than stated age Cardiac: Regular rate and rhythm, no murmurs appreciated Respiratory: Normal work of breathing, lungs clear to auscultation bilaterally, no tenderness to palpation of chest wall Abdomen: Soft, nontender, positive bowel sounds MSK: No gross abnormalities Derm: Patient has persistent of hyperpigmented lesions on extremities but stable/improved from previous exam  ASSESSMENT/PLAN:   Chest pain Patient continues to have issues with persistent chest pain.  Has been present intermittently for "years".  Reports worse with exertion.  Differential includes stable angina versus GI etiology versus costochondritis which is less likely given physical exam.  Will refer to cardiology for further evaluation.  We will also refer to GI given abnormal findings on abdominal CT scan when in the hospital.  We will continue pantoprazole.  Also initiated aspirin 81 mg daily.  Collected lipid panel today because there is not  one on file and will initiate statin if necessary.  Strict ED and return precautions given.     Gifford Shave, MD [place]

## 2020-12-29 ENCOUNTER — Telehealth: Payer: Self-pay | Admitting: Family Medicine

## 2020-12-29 LAB — LIPID PANEL
Chol/HDL Ratio: 4.4 ratio (ref 0.0–5.0)
Cholesterol, Total: 206 mg/dL — ABNORMAL HIGH (ref 100–199)
HDL: 47 mg/dL (ref >39)
LDL Chol Calc (NIH): 131 mg/dL — ABNORMAL HIGH (ref 0–99)
Triglycerides: 157 mg/dL — ABNORMAL HIGH (ref 0–149)
VLDL Cholesterol Cal: 28 mg/dL (ref 5–40)

## 2020-12-29 MED ORDER — ROSUVASTATIN CALCIUM 10 MG PO TABS: 10.0000 mg | ORAL_TABLET | Freq: Every day | ORAL | 3 refills | Status: DC

## 2020-12-29 NOTE — Telephone Encounter (Signed)
Called patient and his wife answered.  She with a translator for the visit yesterday.  Discussed the results from his cholesterol panel.  ASCVD risk of 8.3.  Moderate to high intensity statin recommended.  Patient is agreeable to start a statin at this time.  Crestor 10 mg sent to patient's pharmacy.  No further questions or concerns.

## 2020-12-29 NOTE — Assessment & Plan Note (Signed)
Patient continues to have issues with persistent chest pain.  Has been present intermittently for "years".  Reports worse with exertion.  Differential includes stable angina versus GI etiology versus costochondritis which is less likely given physical exam.  Will refer to cardiology for further evaluation.  We will also refer to GI given abnormal findings on abdominal CT scan when in the hospital.  We will continue pantoprazole.  Also initiated aspirin 81 mg daily.  Collected lipid panel today because there is not one on file and will initiate statin if necessary.  Strict ED and return precautions given.

## 2021-01-04 ENCOUNTER — Encounter: Payer: Self-pay | Admitting: Gastroenterology

## 2021-01-18 ENCOUNTER — Ambulatory Visit (INDEPENDENT_AMBULATORY_CARE_PROVIDER_SITE_OTHER): Payer: 59 | Admitting: Gastroenterology

## 2021-01-18 ENCOUNTER — Encounter: Payer: Self-pay | Admitting: Gastroenterology

## 2021-01-18 VITALS — BP 110/70 | HR 80 | Ht 64.0 in | Wt 114.0 lb

## 2021-01-18 DIAGNOSIS — R1084 Generalized abdominal pain: Secondary | ICD-10-CM

## 2021-01-18 DIAGNOSIS — R079 Chest pain, unspecified: Secondary | ICD-10-CM

## 2021-01-18 DIAGNOSIS — Z1212 Encounter for screening for malignant neoplasm of rectum: Secondary | ICD-10-CM

## 2021-01-18 DIAGNOSIS — R933 Abnormal findings on diagnostic imaging of other parts of digestive tract: Secondary | ICD-10-CM | POA: Diagnosis not present

## 2021-01-18 DIAGNOSIS — K921 Melena: Secondary | ICD-10-CM

## 2021-01-18 DIAGNOSIS — Z1211 Encounter for screening for malignant neoplasm of colon: Secondary | ICD-10-CM

## 2021-01-18 MED ORDER — PLENVU 140 G PO SOLR: ORAL | 0 refills | Status: DC

## 2021-01-18 MED ORDER — PANTOPRAZOLE SODIUM 40 MG PO TBEC: 40.0000 mg | DELAYED_RELEASE_TABLET | Freq: Every day | ORAL | 3 refills | Status: DC

## 2021-01-18 NOTE — Progress Notes (Signed)
HPI : Jack Moss is a 60 year old [place] male accompanied by his wife who provides translation, referred by Lucita Lora for further evaluation of chest pain and an abnormal CT scan.  The patient had presented to the emergency room in mid July with symptoms of chest pain.  Cardiology evaluation to include troponins and EKG was unremarkable.  A CT scan was performed which showed a dilated pancreatic duct without obvious obstruction or evidence of pancreatitis.  CT scan also showed an unusual course of his sigmoid colon passing posteriorly through the small bowel mesentery of unclear significance. The patient reports he has had problems with pain in his chest for 5 years now.  He says he experiences the pain every day.  It never completely goes away.  Pain is noticed more often in the morning.  He has difficult to describe the pain, whether it is a burning pain versus sharp, stabbing pain versus dull achy pain.  The pain does seem to be improved with Protonix, and he also sometimes takes honey which helps.  He denies any difficulty swallowing.  He does have occasional coughing and throat irritation. He also reports pain in his lower abdomen, again which is difficult for him to describe.  This has also been going on for years.  He reports regular bowel movements and denies problems with chronic constipation or diarrhea.  He reports frequent blood in the stool which he says is from hemorrhoids.  He describes what sounds like prolapsing internal hemorrhoids.  He has never had a colonoscopy. He denies any history that would be consistent with a history of pancreatitis.  He does not drink alcohol.  He does smoke cigarettes, usually 3 to 4 cigarettes/day.  No family history of pancreatitis or pancreatic cancer.  His weight has been stable.   Past Medical History:  Diagnosis Date   GERD (gastroesophageal reflux disease)    Hyperlipidemia      History reviewed. No pertinent surgical history. History  reviewed. No pertinent family history. Social History   Tobacco Use   Smoking status: Every Day   Smokeless tobacco: Never  Substance Use Topics   Alcohol use: Never   Drug use: Never   Current Outpatient Medications  Medication Sig Dispense Refill   aspirin EC 81 MG tablet Take 1 tablet (81 mg total) by mouth daily. Swallow whole. 30 tablet 11   pantoprazole (PROTONIX) 20 MG tablet Take 1 tablet (20 mg total) by mouth daily. 30 tablet 0   rosuvastatin (CRESTOR) 10 MG tablet Take 1 tablet (10 mg total) by mouth daily. 90 tablet 3   No current facility-administered medications for this visit.   No Known Allergies   Review of Systems: All systems reviewed and negative except where noted in HPI.    No results found.  Physical Exam: Ht '5\' 4"'$  (1.626 m)   Wt 114 lb (51.7 kg)   BMI 19.57 kg/m  Constitutional: Pleasant,well-developed, [place] male in no acute distress.  Accompanied by wife who serves as Optometrist HEENT: Normocephalic and atraumatic. Conjunctivae are normal. No scleral icterus. Cardiovascular: Normal rate, regular rhythm.  Pulmonary/chest: Effort normal and breath sounds normal. No wheezing, rales or rhonchi. Abdominal: Soft, nondistended, multifocal tenderness to palpation, without guarding or rigidity. Bowel sounds active throughout. There are no masses palpable. No hepatomegaly. Extremities: no edema Neurological: Alert and oriented to person place and time. Skin: Skin is warm and dry. No rashes noted. Psychiatric: Normal mood and affect. Behavior is normal.  CBC  Component Value Date/Time   WBC 5.5 11/25/2020 1037   RBC 4.34 11/25/2020 1037   HGB 13.5 11/25/2020 1037   HCT 40.1 11/25/2020 1037   PLT 345 11/25/2020 1037   MCV 92.4 11/25/2020 1037   MCH 31.1 11/25/2020 1037   MCHC 33.7 11/25/2020 1037   RDW 13.2 11/25/2020 1037    CMP     Component Value Date/Time   NA 138 11/25/2020 1037   K 3.9 11/25/2020 1037   CL 107 11/25/2020 1037   CO2  25 11/25/2020 1037   GLUCOSE 105 (H) 11/25/2020 1037   BUN 15 11/25/2020 1037   CREATININE 0.90 11/25/2020 1037   CALCIUM 9.2 11/25/2020 1037   PROT 6.9 11/25/2020 1037   ALBUMIN 3.8 11/25/2020 1037   AST 26 11/25/2020 1037   ALT 31 11/25/2020 1037   ALKPHOS 41 11/25/2020 1037   BILITOT 0.2 (L) 11/25/2020 1037   GFRNONAA >60 11/25/2020 1037     ASSESSMENT AND PLAN: 60 year old [place] male with several years of vague chest and abdominal pain.  Chest pain is somewhat improved with Protonix.  He has some other symptoms suggestive of GERD such as cough and throat irritation.  A recent CT scan showed a dilated pancreatic duct and a variant anatomy of his colon is unclear if either of these abnormalities relate to his symptoms at all.  I recommended we perform an upper endoscopy to evaluate for evidence of esophagitis, peptic ulcer disease/H. pylori.  We will also perform a colonoscopy for colon cancer screening.  We will get an MRCP to further evaluate the dilated pancreatic duct.  The patient is interested in potential hemorrhoid banding based on his report of what sounds like grade 3 internal hemorrhoids.  Following his colonoscopy, if no other abnormalities are found to explain his symptoms, he can undergo hemorrhoid banding.  In the meantime, I recommended he take a daily fiber supplement (psyllium based) to help reduce his hemorrhoid symptoms.  Atypical chest pain/abdominal pain - EGD - Continue Protonix  Dilated pancreatic duct - MRCP  Colon cancer screening - Screening colonoscopy  Hematochezia - Suspect third-degree hemorrhoids, will evaluate further with colonoscopy - Daily Metamucil initially, banding if no improvement and no other etiology for his hematochezia found on colonoscopy  The details, risks (including bleeding, perforation, infection, missed lesions, medication reactions and possible hospitalization or surgery if complications occur), benefits, and alternatives to  EGD/colonoscopy with possible biopsy and possible polypectomy were discussed with the patient and he consents to proceed.   Vanissa Strength E. Candis Schatz, MD [facility]   CC: Gifford Shave, MD

## 2021-01-18 NOTE — Patient Instructions (Signed)
If you are age 58 or older, your body mass index should be between 23-30. Your Body mass index is 19.57 kg/m. If this is out of the aforementioned range listed, please consider follow up with your Primary Care Provider.  If you are age 59 or younger, your body mass index should be between 19-25. Your Body mass index is 19.57 kg/m. If this is out of the aformentioned range listed, please consider follow up with your Primary Care Provider.   You will be contacted by [facility] in the next 2 days to arrange a MRCP .  The number on your caller ID will be [phone], please answer when they call.  If you have not heard from them in 2 days please call [phone] to schedule.     We have sent the following medications to your pharmacy for you to pick up at your convenience: Pantoprazole 40 mg   You have been scheduled for an endoscopy and colonoscopy. Please follow the written instructions given to you at your visit today. Please pick up your prep supplies at the pharmacy within the next 1-3 days. If you use inhalers (even only as needed), please bring them with you on the day of your procedure.  The [facility] providers would like to encourage you to use [facility] to communicate with providers for non-urgent requests or questions.  Due to long hold times on the telephone, sending your provider a message by [facility] may be a faster and more efficient way to get a response.  Please allow 48 business hours for a response.  Please remember that this is for non-urgent requests.   It was a pleasure to see you today!  Thank you for trusting me with your gastrointestinal care!    Scott E. Candis Schatz, MD

## 2021-01-25 ENCOUNTER — Encounter: Payer: Self-pay | Admitting: Family Medicine

## 2021-01-25 ENCOUNTER — Ambulatory Visit (INDEPENDENT_AMBULATORY_CARE_PROVIDER_SITE_OTHER): Payer: 59 | Admitting: Family Medicine

## 2021-01-25 ENCOUNTER — Other Ambulatory Visit: Payer: Self-pay

## 2021-01-25 VITALS — BP 104/60 | HR 81 | Ht 64.0 in | Wt 112.4 lb

## 2021-01-25 DIAGNOSIS — L819 Disorder of pigmentation, unspecified: Secondary | ICD-10-CM | POA: Diagnosis not present

## 2021-01-25 DIAGNOSIS — M65332 Trigger finger, left middle finger: Secondary | ICD-10-CM

## 2021-01-25 DIAGNOSIS — M25569 Pain in unspecified knee: Secondary | ICD-10-CM | POA: Diagnosis not present

## 2021-01-25 MED ORDER — METHYLPREDNISOLONE ACETATE 40 MG/ML IJ SUSP
40.0000 mg | Freq: Once | INTRAMUSCULAR | Status: AC
  Administered 2021-01-25: 40 mg via INTRAMUSCULAR

## 2021-01-25 MED ORDER — ASPIRIN EC 81 MG PO TBEC: 81.0000 mg | DELAYED_RELEASE_TABLET | Freq: Every day | ORAL | 11 refills | Status: AC

## 2021-01-25 MED ORDER — DIPHENHYDRAMINE-ZINC ACETATE 2-0.1 % EX CREA: 1.0000 "application " | TOPICAL_CREAM | Freq: Three times a day (TID) | CUTANEOUS | 0 refills | Status: AC | PRN

## 2021-01-25 NOTE — Assessment & Plan Note (Signed)
Patient complains of difficulty gripping things because of the trigger finger.  Noticeable in the left middle finger.  Referral placed for sports medicine.

## 2021-01-25 NOTE — Assessment & Plan Note (Signed)
Chronic knee pain, right worse than left.  Discussed knee injection and patient would like to try knee injection to see if it helps with the pain.  Injection completed, see note above.

## 2021-01-25 NOTE — Progress Notes (Signed)
    SUBJECTIVE:   CHIEF COMPLAINT / HPI:   Chest pain follow-up Pain is improved.  Patient saw gastroenterology and they have increased the amount of pantoprazole he is taking from 20 to 40 mg daily.  He reports that the pain still occasionally occurs but not near as often.  He is scheduled to follow-up with cardiology in November of this year.  No current chest pain.  Hemorrhoid concerns  Patient reports issues with hemorrhoids but says that he spoke with a stomach doctor about the hemorrhoids and the stomach doctor requested that he wait until after his EGD and MRCP to have the hemorrhoids evaluated.  The patient reports that the GI doctor said he would take a look at the hemorrhoids and fix if needed.    Trigger finger Patient has trigger finger of left middle finger.  This is bothering him at work when he has to [employer].  He works in [employer] and so he needs to make sure he can grab the cooking utensils, pots, pans.  Right knee pain Patient reports chronic right knee pain with crepitus.  He reports that it hurts to walk on it and stand on it.  Denies any fever, chills, redness to the joint.  OBJECTIVE:   BP 104/60   Pulse 81   Ht '5\' 4"'$  (1.626 m)   Wt 112 lb 6.4 oz (51 kg)   SpO2 100%   BMI 19.29 kg/m   General: Appears older than age.  Resting comfortably in chair next to exam table Cardiac: Regular rate and rhythm, no murmurs appreciated, no chest wall tenderness Respiratory: Normal work of breathing, lungs clear to auscultation bilaterally Abdomen: Soft, nontender, positive bowel sounds MSK: Patient with exam consistent with trigger finger of the left middle finger.  Patient has noticeable crepitus in right knee, no swelling, erythema noted  After informed written consent timeout was performed, patient was lying supine on exam table.  right knee was prepped with alcohol swab.  Utilizing superolateral approach, knee was then injected with 3:1 lidocaine:depomedrol.   Patient tolerated procedure well without immediate complications   ASSESSMENT/PLAN:   Trigger middle finger of left hand Patient complains of difficulty gripping things because of the trigger finger.  Noticeable in the left middle finger.  Referral placed for sports medicine.  Hyperpigmented skin lesion Hyperpigmented lesions continue to bother the patient with itching.  They have improved from previous exam but still present.  Sent prescription for Benadryl cream to patient's pharmacy.  Knee pain Chronic knee pain, right worse than left.  Discussed knee injection and patient would like to try knee injection to see if it helps with the pain.  Injection completed, see note above.     Gifford Shave, MD [place]

## 2021-01-25 NOTE — Patient Instructions (Signed)
It was great seeing you today.  For your knee pain have given you a knee injection.  It may hurt tomorrow but should improve over the next few days.  Regarding your hand concerns you have trigger finger in your left middle finger and I have placed a referral for sports medicine for them to release that trigger finger.  Please be sure to continue to follow with your stomach doctors and be sure to not miss your appointment with the cardiology.  I have refilled your aspirin.  Continue to take the pantoprazole 40 mg daily and I will get rid of the 20 mg pantoprazole.  You have any questions or concerns call the clinic.  I hope you have a wonderful day!

## 2021-01-25 NOTE — Assessment & Plan Note (Signed)
Hyperpigmented lesions continue to bother the patient with itching.  They have improved from previous exam but still present.  Sent prescription for Benadryl cream to patient's pharmacy.

## 2021-01-26 ENCOUNTER — Ambulatory Visit (HOSPITAL_COMMUNITY): Payer: 59

## 2021-01-27 ENCOUNTER — Encounter: Payer: Self-pay | Admitting: Gastroenterology

## 2021-01-27 ENCOUNTER — Ambulatory Visit: Payer: Self-pay | Admitting: Gastroenterology

## 2021-01-27 VITALS — BP 135/64 | HR 68 | Temp 96.9°F | Resp 15 | Ht 64.0 in | Wt 114.0 lb

## 2021-01-27 DIAGNOSIS — K2980 Duodenitis without bleeding: Secondary | ICD-10-CM

## 2021-01-27 DIAGNOSIS — D123 Benign neoplasm of transverse colon: Secondary | ICD-10-CM

## 2021-01-27 DIAGNOSIS — R1084 Generalized abdominal pain: Secondary | ICD-10-CM

## 2021-01-27 DIAGNOSIS — D124 Benign neoplasm of descending colon: Secondary | ICD-10-CM

## 2021-01-27 DIAGNOSIS — Z1211 Encounter for screening for malignant neoplasm of colon: Secondary | ICD-10-CM

## 2021-01-27 DIAGNOSIS — K2950 Unspecified chronic gastritis without bleeding: Secondary | ICD-10-CM

## 2021-01-27 DIAGNOSIS — D125 Benign neoplasm of sigmoid colon: Secondary | ICD-10-CM

## 2021-01-27 MED ORDER — SODIUM CHLORIDE 0.9 % IV SOLN: 500.0000 mL | Freq: Once | INTRAVENOUS | Status: DC

## 2021-01-27 NOTE — Patient Instructions (Signed)
Handout given:  polyps Resume previous diet Continue current medications Await pathology results  Follow up in GI clinic with Dr. Candis Schatz  YOU HAD AN ENDOSCOPIC PROCEDURE TODAY AT THE [facility]:   Refer to the procedure report that was given to you for any specific questions about what was found during the examination.  If the procedure report does not answer your questions, please call your gastroenterologist to clarify.  If you requested that your care partner not be given the details of your procedure findings, then the procedure report has been included in a sealed envelope for you to review at your convenience later.  YOU SHOULD EXPECT: Some feelings of bloating in the abdomen. Passage of more gas than usual.  Walking can help get rid of the air that was put into your GI tract during the procedure and reduce the bloating. If you had a lower endoscopy (such as a colonoscopy or flexible sigmoidoscopy) you may notice spotting of blood in your stool or on the toilet paper. If you underwent a bowel prep for your procedure, you may not have a normal bowel movement for a few days.  Please Note:  You might notice some irritation and congestion in your nose or some drainage.  This is from the oxygen used during your procedure.  There is no need for concern and it should clear up in a day or so.  SYMPTOMS TO REPORT IMMEDIATELY:  Following lower endoscopy (colonoscopy or flexible sigmoidoscopy):  Excessive amounts of blood in the stool  Significant tenderness or worsening of abdominal pains  Swelling of the abdomen that is new, acute  Fever of 100F or higher  Following upper endoscopy (EGD)  Vomiting of blood or coffee ground material  New chest pain or pain under the shoulder blades  Painful or persistently difficult swallowing  New shortness of breath  Fever of 100F or higher  Black, tarry-looking stools  For urgent or emergent issues, a gastroenterologist can be reached  at any hour by calling [phone]. Do not use MyChart messaging for urgent concerns.   DIET:  We do recommend a small meal at first, but then you may proceed to your regular diet.  Drink plenty of fluids but you should avoid alcoholic beverages for 24 hours.  ACTIVITY:  You should plan to take it easy for the rest of today and you should NOT DRIVE or use heavy machinery until tomorrow (because of the sedation medicines used during the test).    FOLLOW UP: Our staff will call the number listed on your records 48-72 hours following your procedure to check on you and address any questions or concerns that you may have regarding the information given to you following your procedure. If we do not reach you, we will leave a message.  We will attempt to reach you two times.  During this call, we will ask if you have developed any symptoms of COVID 19. If you develop any symptoms (ie: fever, flu-like symptoms, shortness of breath, cough etc.) before then, please call [phone].  If you test positive for Covid 19 in the 2 weeks post procedure, please call and report this information to [place].    If any biopsies were taken you will be contacted by phone or by letter within the next 1-3 weeks.  Please call us at [phone] if you have not heard about the biopsies in 3 weeks.   SIGNATURES/CONFIDENTIALITY: You and/or your care partner have signed paperwork which will be  entered into your electronic medical record.  These signatures attest to the fact that that the information above on your After Visit Summary has been reviewed and is understood.  Full responsibility of the confidentiality of this discharge information lies with you and/or your care-partner.

## 2021-01-27 NOTE — Progress Notes (Signed)
History and Physical Interval Note: No changes in patient's symptoms since the clinic visit Sept 6th.  Abdominal pain a little improved with the bowel prep.   01/27/2021 4:01 PM  Jack Moss  has presented today for endoscopic procedure(s), with the diagnosis of  Encounter Diagnoses  Name Primary?   Screening for colorectal cancer Yes   Generalized abdominal pain   .  The various methods of evaluation and treatment have been discussed with the patient and/or family. After consideration of risks, benefits and other options for treatment, the patient has consented to  the endoscopic procedure(s).   The patient's history has been reviewed, patient examined, no change in status, stable for endoscopic procedure(s).  I have reviewed the patient's chart and labs.  Questions were answered to the patient's satisfaction.     Hughes Wyndham E. Candis Schatz, MD [facility]

## 2021-01-27 NOTE — Op Note (Signed)
[place] Patient Name: Jack Moss Procedure Date: 01/27/2021 3:51 PM MRN: [id number] Endoscopist: Nicki Reaper E. Candis Schatz , MD Age: 60 Referring MD:  Date of Birth: 1960/11/18 Gender: Male Account #: [id number] Procedure:                Colonoscopy Indications:              Screening for colorectal malignant neoplasm, This                            is the patient's first colonoscopy Medicines:                Monitored Anesthesia Care Procedure:                Pre-Anesthesia Assessment:                           - Prior to the procedure, a History and Physical                            was performed, and patient medications and                            allergies were reviewed. The patient's tolerance of                            previous anesthesia was also reviewed. The risks                            and benefits of the procedure and the sedation                            options and risks were discussed with the patient.                            All questions were answered, and informed consent                            was obtained. Prior Anticoagulants: The patient has                            taken no previous anticoagulant or antiplatelet                            agents. ASA Grade Assessment: II - A patient with                            mild systemic disease. After reviewing the risks                            and benefits, the patient was deemed in                            satisfactory condition to undergo the procedure.  After obtaining informed consent, the colonoscope                            was passed under direct vision. Throughout the                            procedure, the patient's blood pressure, pulse, and                            oxygen saturations were monitored continuously. The                            CF [id number] was introduced through the anus                            and advanced to the the  terminal ileum, with                            identification of the appendiceal orifice and IC                            valve. The colonoscopy was unusually difficult due                            to a redundant colon, significant looping and a                            tortuous colon. Successful completion of the                            procedure was aided by using manual pressure. The                            patient tolerated the procedure well. The quality                            of the bowel preparation was good. The terminal                            ileum, ileocecal valve, appendiceal orifice, and                            rectum were photographed. The bowel preparation                            used was Plenvu via split dose instruction. Scope In: 4:22:41 PM Scope Out: [id number] PM Scope Withdrawal Time: 0 hours 18 minutes 17 seconds  Total Procedure Duration: 0 hours 33 minutes 30 seconds  Findings:                 The perianal and digital rectal examinations were                            normal. Pertinent negatives  include normal                            sphincter tone and no palpable rectal lesions.                           Two sessile polyps were found in the transverse                            colon. The polyps were 5 to 6 mm in size. These                            polyps were removed with a cold snare. Resection                            and retrieval were complete. Estimated blood loss                            was minimal.                           A 8 mm polyp was found in the descending colon. The                            polyp was sessile. The polyp was removed with a                            cold snare. Resection and retrieval were complete.                            Estimated blood loss was minimal.                           A 12 mm polyp was found in the sigmoid colon. The                            polyp was semi-pedunculated. The  polyp was removed                            with a hot snare. Resection and retrieval were                            complete. Estimated blood loss: none.                           A single medium-sized angiodysplastic lesion                            without bleeding was found in the ascending colon.                           The colon (entire examined portion) was  significantly tortuous.                           The exam was otherwise normal throughout the                            examined colon.                           Non-bleeding internal hemorrhoids were found during                            retroflexion. The hemorrhoids were Grade III                            (internal hemorrhoids that prolapse but require                            manual reduction).                           No additional abnormalities were found on                            retroflexion. Complications:            No immediate complications. Estimated Blood Loss:     Estimated blood loss was minimal. Impression:               - Two 5 to 6 mm polyps in the transverse colon,                            removed with a cold snare. Resected and retrieved.                           - One 8 mm polyp in the descending colon, removed                            with a cold snare. Resected and retrieved.                           - One 12 mm polyp in the sigmoid colon, removed                            with a hot snare. Resected and retrieved.                           - A single non-bleeding colonic angiodysplastic                            lesion.                           - Tortuous colon.                           - Non-bleeding internal hemorrhoids. Recommendation:           -  Patient has a contact number available for                            emergencies. The signs and symptoms of potential                            delayed complications were discussed with the                             patient. Return to normal activities tomorrow.                            Written discharge instructions were provided to the                            patient.                           - Resume previous diet.                           - Continue present medications.                           - Await pathology results.                           - Repeat colonoscopy in 3 years for surveillance.                           - If desired, patient can follow up in GI clinic                            for hemorrhoid banding. Tariah Transue E. Candis Schatz, MD 01/27/2021 5:10:29 PM This report has been signed electronically.

## 2021-01-27 NOTE — Progress Notes (Signed)
Sedate, gd SR, tolerated procedure well, VSS, report to RN 

## 2021-01-27 NOTE — Progress Notes (Signed)
VS by DT  Pt's states no medical or surgical changes since previsit or office visit.  

## 2021-01-27 NOTE — Progress Notes (Signed)
Called to room to assist during endoscopic procedure.  Patient ID and intended procedure confirmed with present staff. Received instructions for my participation in the procedure from the performing physician.  

## 2021-01-27 NOTE — Progress Notes (Signed)
Reida Hem CRNA relieves Monday CRNA 

## 2021-01-27 NOTE — Op Note (Signed)
[place] Patient Name: Jack Moss Procedure Date: 01/27/2021 3:59 PM MRN: [id number] Endoscopist: Nicki Reaper E. Candis Schatz , MD Age: 60 Referring MD:  Date of Birth: May 20, 1960 Gender: Male Account #: [id number] Procedure:                Upper GI endoscopy Indications:              Generalized abdominal pain, Unexplained chest pain Medicines:                Monitored Anesthesia Care Procedure:                Pre-Anesthesia Assessment:                           - Prior to the procedure, a History and Physical                            was performed, and patient medications and                            allergies were reviewed. The patient's tolerance of                            previous anesthesia was also reviewed. The risks                            and benefits of the procedure and the sedation                            options and risks were discussed with the patient.                            All questions were answered, and informed consent                            was obtained. Prior Anticoagulants: The patient has                            taken no previous anticoagulant or antiplatelet                            agents. ASA Grade Assessment: II - A patient with                            mild systemic disease. After reviewing the risks                            and benefits, the patient was deemed in                            satisfactory condition to undergo the procedure.                           After obtaining informed consent, the endoscope was  passed under direct vision. Throughout the                            procedure, the patient's blood pressure, pulse, and                            oxygen saturations were monitored continuously. The                            Endoscope was introduced through the mouth, and                            advanced to the third part of duodenum. The upper                            GI  endoscopy was accomplished without difficulty.                            The patient tolerated the procedure well. Scope In: Scope Out: Findings:                 The examined portions of the nasopharynx,                            oropharynx and larynx were normal.                           The examined esophagus was normal.                           Diffuse granular mucosa was found in the gastric                            body and in the gastric antrum. Biopsies were taken                            with a cold forceps for histology. Estimated blood                            loss was minimal.                           The exam of the stomach was otherwise normal.                           Patchy mild inflammation characterized by erythema                            was found in the duodenal bulb. Biopsies were taken                            with a cold forceps for histology. Estimated blood                            loss  was minimal.                           Scarring and deformity was found in the duodenal                            bulb.                           The exam of the duodenum was otherwise normal. Complications:            No immediate complications. Estimated Blood Loss:     Estimated blood loss was minimal. Impression:               - The examined portions of the nasopharynx,                            oropharynx and larynx were normal.                           - Normal esophagus.                           - Granular gastric mucosa. Biopsied.                           - Duodenitis. Biopsied.                           - Scarred, erythematous duodenal bulb, suggestive                            of a prior history of peptic ulcer disease. Recommendation:           - Patient has a contact number available for                            emergencies. The signs and symptoms of potential                            delayed complications were discussed with the                             patient. Return to normal activities tomorrow.                            Written discharge instructions were provided to the                            patient.                           - Resume previous diet.                           - Continue present medications.                           -  Await pathology results.                           - Follow up in GI clinic with Dr. Dustin Flock E. Candis Schatz, MD 01/27/2021 5:03:29 PM This report has been signed electronically.

## 2021-01-28 ENCOUNTER — Telehealth: Payer: Self-pay

## 2021-01-28 NOTE — Telephone Encounter (Signed)
Called and spoke with patient's wife and scheduled patient for a follow up appointment.She requested a Tuesday or Thursday. Patient was scheduled 03/01/21 at 8:30 am. Patient's wife also requested that reschedule MRCP for a Tuesday or Thursday patient has been rescheduled for 02/08/21.

## 2021-01-31 ENCOUNTER — Ambulatory Visit (HOSPITAL_COMMUNITY): Admission: RE | Admit: 2021-01-31 | Payer: 59 | Source: Ambulatory Visit

## 2021-01-31 ENCOUNTER — Telehealth: Payer: Self-pay

## 2021-01-31 NOTE — Telephone Encounter (Signed)
  Follow up Call-  Call back number 01/27/2021  Post procedure Call Back phone  # [phone]  Permission to leave phone message Yes  Some recent data might be hidden     Patient questions:  Do you have a fever, pain , or abdominal swelling? No. Pain Score  0 *  Have you tolerated food without any problems? Yes.    Have you been able to return to your normal activities? Yes.    Do you have any questions about your discharge instructions: Diet   No. Medications  No. Follow up visit  No.  Do you have questions or concerns about your Care? No.  Actions: * If pain score is 4 or above: No action needed, pain <4.

## 2021-02-03 ENCOUNTER — Other Ambulatory Visit: Payer: Self-pay

## 2021-02-03 DIAGNOSIS — A048 Other specified bacterial intestinal infections: Secondary | ICD-10-CM

## 2021-02-03 MED ORDER — DOXYCYCLINE HYCLATE 100 MG PO CAPS: 100.0000 mg | ORAL_CAPSULE | Freq: Two times a day (BID) | ORAL | 0 refills | Status: AC

## 2021-02-03 MED ORDER — BISMUTH SUBSALICYLATE 262 MG PO CHEW: 524.0000 mg | CHEWABLE_TABLET | Freq: Four times a day (QID) | ORAL | 0 refills | Status: AC

## 2021-02-03 MED ORDER — METRONIDAZOLE 250 MG PO TABS: 250.0000 mg | ORAL_TABLET | Freq: Four times a day (QID) | ORAL | 0 refills | Status: AC

## 2021-02-08 ENCOUNTER — Other Ambulatory Visit: Payer: Self-pay

## 2021-02-08 ENCOUNTER — Ambulatory Visit (HOSPITAL_COMMUNITY)
Admission: RE | Admit: 2021-02-08 | Discharge: 2021-02-08 | Disposition: A | Discharge status: Home / Self Care | Payer: 59 | Source: Ambulatory Visit | Attending: Gastroenterology | Admitting: Gastroenterology

## 2021-02-08 DIAGNOSIS — Z1211 Encounter for screening for malignant neoplasm of colon: Secondary | ICD-10-CM

## 2021-02-08 DIAGNOSIS — R079 Chest pain, unspecified: Secondary | ICD-10-CM

## 2021-03-01 ENCOUNTER — Encounter: Payer: Self-pay | Admitting: Gastroenterology

## 2021-03-01 ENCOUNTER — Ambulatory Visit (INDEPENDENT_AMBULATORY_CARE_PROVIDER_SITE_OTHER): Payer: 59 | Admitting: Gastroenterology

## 2021-03-01 VITALS — BP 110/70 | HR 95 | Ht 64.0 in | Wt 111.5 lb

## 2021-03-01 DIAGNOSIS — K2970 Gastritis, unspecified, without bleeding: Secondary | ICD-10-CM | POA: Diagnosis not present

## 2021-03-01 DIAGNOSIS — K642 Third degree hemorrhoids: Secondary | ICD-10-CM | POA: Diagnosis not present

## 2021-03-01 DIAGNOSIS — D126 Benign neoplasm of colon, unspecified: Secondary | ICD-10-CM | POA: Diagnosis not present

## 2021-03-01 DIAGNOSIS — R933 Abnormal findings on diagnostic imaging of other parts of digestive tract: Secondary | ICD-10-CM | POA: Diagnosis not present

## 2021-03-01 DIAGNOSIS — B9681 Helicobacter pylori [H. pylori] as the cause of diseases classified elsewhere: Secondary | ICD-10-CM

## 2021-03-01 NOTE — Patient Instructions (Addendum)
If you are age 59 or older, your body mass index should be between 23-30. Your Body mass index is 19.14 kg/m. If this is out of the aforementioned range listed, please consider follow up with your Primary Care Provider.  If you are age 63 or younger, your body mass index should be between 19-25. Your Body mass index is 19.14 kg/m. If this is out of the aformentioned range listed, please consider follow up with your Primary Care Provider.   I will call Radiology and give you a call back.  The [facility] providers would like to encourage you to use [facility] to communicate with providers for non-urgent requests or questions.  Due to long hold times on the telephone, sending your provider a message by [facility] may be a faster and more efficient way to get a response.  Please allow 48 business hours for a response.  Please remember that this is for non-urgent requests.   It was a pleasure to see you today!  Thank you for trusting me with your gastrointestinal care!    Scott E. Candis Schatz, MD

## 2021-03-01 NOTE — Progress Notes (Signed)
HPI :  Jack Moss is a 60 year old male who I saw in clinic September 6 with multiple vague GI complaints to include atypical chest pain and abdominal pain.  The symptoms have been going on for years.  He has also bothered by internal hemorrhoids.  He underwent an EGD and colonoscopy September 15th showed granular gastric mucosa that was positive for H. pylori gastritis.  He was prescribed quadruple therapy, which he completed and has helped significantly with his upper GI symptoms.  His colonoscopy was notable for grade 3 internal hemorrhoids and 4 polyps, 1 of which was a pedunculated polyp with focal high-grade dysplasia.  He was recommended to repeat colonoscopy in 3 years. He still continues to have blood with most every bowel movement.  He has a bowel movement on a daily basis.  He denies significant constipation or diarrhea.  Stools are typically formed and soft with minimal straining.  He reports symptoms consistent with third-degree prolapsing hemorrhoids.  He reports discomfort with prolapse.  Denies problems with seepage or incontinence. He had a CT was notable for a dilated pancreatic duct.  An MRI was ordered for further evaluation, however the patient states that this was canceled due to the lack of translator availability.  He was told that he would be contacted by the radiology department to reschedule the MRI, but he has not heard anything over 2 weeks.     Past Medical History:  Diagnosis Date   GERD (gastroesophageal reflux disease)    Hyperlipidemia      No past surgical history on file. Family History  Problem Relation Age of Onset   Colon cancer Neg Hx    Esophageal cancer Neg Hx    Rectal cancer Neg Hx    Stomach cancer Neg Hx    Social History   Tobacco Use   Smoking status: Every Day   Smokeless tobacco: Never  Vaping Use   Vaping Use: Never used  Substance Use Topics   Alcohol use: Never   Drug use: Never   Current Outpatient Medications  Medication Sig  Dispense Refill   aspirin EC 81 MG tablet Take 1 tablet (81 mg total) by mouth daily. Swallow whole. 30 tablet 11   diphenhydrAMINE-zinc acetate (BENADRYL EXTRA STRENGTH) cream Apply 1 application topically 3 (three) times daily as needed for itching. 28.4 g 0   pantoprazole (PROTONIX) 40 MG tablet Take 1 tablet (40 mg total) by mouth daily. 90 tablet 3   rosuvastatin (CRESTOR) 10 MG tablet Take 1 tablet (10 mg total) by mouth daily. 90 tablet 3   Current Facility-Administered Medications  Medication Dose Route Frequency Provider Last Rate Last Admin   0.9 %  sodium chloride infusion  500 mL Intravenous Once Daryel November, MD       No Known Allergies   Review of Systems: All systems reviewed and negative except where noted in HPI.    No results found.  Physical Exam: There were no vitals taken for this visit. Constitutional: Pleasant,well-developed, [place] male in no acute distress.  Accompanied by spouse who serves as medical interpreter HEENT: Normocephalic and atraumatic. Conjunctivae are normal. No scleral icterus. Neurological: Alert and oriented to person place and time. Skin: Skin is warm and dry. No rashes noted. Psychiatric: Normal mood and affect. Behavior is normal.  CBC    Component Value Date/Time   WBC 5.5 11/25/2020 1037   RBC 4.34 11/25/2020 1037   HGB 13.5 11/25/2020 1037   HCT 40.1 11/25/2020 1037  PLT 345 11/25/2020 1037   MCV 92.4 11/25/2020 1037   MCH 31.1 11/25/2020 1037   MCHC 33.7 11/25/2020 1037   RDW 13.2 11/25/2020 1037    CMP     Component Value Date/Time   NA 138 11/25/2020 1037   K 3.9 11/25/2020 1037   CL 107 11/25/2020 1037   CO2 25 11/25/2020 1037   GLUCOSE 105 (H) 11/25/2020 1037   BUN 15 11/25/2020 1037   CREATININE 0.90 11/25/2020 1037   CALCIUM 9.2 11/25/2020 1037   PROT 6.9 11/25/2020 1037   ALBUMIN 3.8 11/25/2020 1037   AST 26 11/25/2020 1037   ALT 31 11/25/2020 1037   ALKPHOS 41 11/25/2020 1037   BILITOT 0.2 (L)  11/25/2020 1037   GFRNONAA >60 11/25/2020 1037     ASSESSMENT AND PLAN: 60 year old male with chronic atypical chest pain and abdominal pain, which has improved significantly with treatment of H. pylori gastritis.  He will be due for repeat H. pylori testing in a few weeks.  He continues to be bothered by grade 3 internal hemorrhoids.  It does not appear he has much room for improvement in his stool consistency or bowel habits.  We discussed the role of hemorrhoid banding and reviewed the details of the procedure including watching videos online.  The patient would wish to proceed with hemorrhoid banding.  We will schedule him today, planning to band 1 hemorrhoid column per session, 2 to 4 weeks apart. Our nursing staff will contact [name] to help facilitate rescheduling the patient's MR with a medical interpreter. The importance of repeat colonoscopy within 3 years was reinforced to the patient, given the presence of focal high-grade dysplasia on one of his polyps.  Atypical chest pain/abdominal pain, likely attributable to H. pylori gastritis - Appears resolved - Check H. pylori stool antigen for eradication November 3  Grade 3 internal hemorrhoids - Schedule hemorrhoid banding  Dilated pancreatic duct - Reorder MRCP to further evaluate  Colon polyp with focal high-grade dysplasia - Surveillance colonoscopy in 3 years  Jannely Henthorn E. Candis Schatz, MD [facility]   CC:  Gifford Shave, MD

## 2021-03-02 ENCOUNTER — Encounter: Payer: Self-pay | Admitting: Gastroenterology

## 2021-03-11 ENCOUNTER — Telehealth: Payer: Self-pay

## 2021-03-11 NOTE — Telephone Encounter (Signed)
Called Radiology to reschedule patient after trying to schedule an interpreter through the language line. I scheduled patient for 03/22/21 and left voice mail for [facility] [employer] to schedule a [place] interpreter to go with patient to his MRI appointment.

## 2021-03-11 NOTE — Telephone Encounter (Signed)
Error

## 2021-03-11 NOTE — Telephone Encounter (Signed)
Called patient and informed him of appointment with Radiology 03/22/21 @ 8am.

## 2021-03-11 NOTE — Telephone Encounter (Signed)
Called and left VM for patients wife to call me back.

## 2021-03-17 ENCOUNTER — Other Ambulatory Visit: Payer: Self-pay

## 2021-03-17 ENCOUNTER — Ambulatory Visit (INDEPENDENT_AMBULATORY_CARE_PROVIDER_SITE_OTHER): Payer: 59 | Admitting: Cardiology

## 2021-03-17 ENCOUNTER — Encounter: Payer: Self-pay | Admitting: Cardiology

## 2021-03-17 VITALS — BP 100/50 | HR 75 | Ht 64.0 in | Wt 108.0 lb

## 2021-03-17 DIAGNOSIS — R072 Precordial pain: Secondary | ICD-10-CM | POA: Diagnosis not present

## 2021-03-17 DIAGNOSIS — Z01812 Encounter for preprocedural laboratory examination: Secondary | ICD-10-CM

## 2021-03-17 MED ORDER — METOPROLOL TARTRATE 25 MG PO TABS: 25.0000 mg | ORAL_TABLET | ORAL | 0 refills | Status: AC

## 2021-03-17 NOTE — Patient Instructions (Signed)
Medication Instructions:  The current medical regimen is effective;  continue present plan and medications.  *If you need a refill on your cardiac medications before your next appointment, please call your pharmacy*  Lab Work: Please have blood work today  (BMP)  If you have labs (blood work) drawn today and your tests are completely normal, you will receive your results only by: MyChart Message (if you have MyChart) OR A paper copy in the mail If you have any lab test that is abnormal or we need to change your treatment, we will call you to review the results.   Testing/Procedures: Your physician has requested that you have an echocardiogram. Echocardiography is a painless test that uses sound waves to create images of your heart. It provides your doctor with information about the size and shape of your heart and how well your heart's chambers and valves are working. This procedure takes approximately one hour. There are no restrictions for this procedure.    Your cardiac CT will be scheduled at one of the below locations:   [facility] [address] [place], [place] [address] [phone]  Please arrive at the [facility] main entrance (entrance A) of [facility] 30 minutes prior to test start time. You can use the FREE valet parking offered at the main entrance (encouraged to control the heart rate for the test) Proceed to the [facility] Radiology Department (first floor) to check-in and test prep.   Please follow these instructions carefully (unless otherwise directed):  Hold all erectile dysfunction medications at least 3 days (72 hrs) prior to test.  On the Night Before the Test: Be sure to Drink plenty of water. Do not consume any caffeinated/decaffeinated beverages or chocolate 12 hours prior to your test. Do not take any antihistamines 12 hours prior to your test.  On the Day of the Test: Drink plenty of water until 1 hour prior to the  test. Do not eat any food 4 hours prior to the test. You may take your regular medications prior to the test.  Take metoprolol (Lopressor) two hours prior to test. HOLD Furosemide/Hydrochlorothiazide morning of the test.  After the Test: Drink plenty of water. After receiving IV contrast, you may experience a mild flushed feeling. This is normal. On occasion, you may experience a mild rash up to 24 hours after the test. This is not dangerous. If this occurs, you can take Benadryl 25 mg and increase your fluid intake. If you experience trouble breathing, this can be serious. If it is severe call 911 IMMEDIATELY. If it is mild, please call our office  Please allow 2-4 weeks for scheduling of routine cardiac CTs. Some insurance companies require a pre-authorization which may delay scheduling of this test.   For non-scheduling related questions, please contact the cardiac imaging nurse navigator should you have any questions/concerns: Marchia Bond, Cardiac Imaging Nurse Navigator Gordy Clement, Cardiac Imaging Nurse Navigator [facility] Direct Office Dial: [phone]   For scheduling needs, including cancellations and rescheduling, please call [place], [phone].  Follow-Up: At [facility], you and your health needs are our priority.  As part of our continuing mission to provide you with exceptional heart care, we have created designated Provider Care Teams.  These Care Teams include your primary Cardiologist (physician) and Advanced Practice Providers (APPs -  Physician Assistants and Nurse Practitioners) who all work together to provide you with the care you need, when you need it.  We  recommend signing up for the patient portal called "MyChart".  Sign up information is provided on this After Visit Summary.  MyChart is used to connect with patients for Virtual Visits (Telemedicine).  Patients are able to view lab/test results, encounter notes, upcoming  appointments, etc.  Non-urgent messages can be sent to your provider as well.   To learn more about what you can do with MyChart, go to [email or web address].    Your next appointment:   Follow up based on results of the above testing  Thank you for choosing [facility]!!

## 2021-03-17 NOTE — Progress Notes (Signed)
Cardiology Office Note:    Date:  03/17/2021   ID:  Jack Moss, DOB [birth year], MRN [id number]  PCP:  Gifford Shave, MD   [facility] Providers Cardiologist:  None     Referring MD: Zenia Resides, MD   History of Present Illness:    Jack Moss is a 60 y.o. male  here for the evaluation of chest pain at the request of Dr. Andria Frames.  He was hospitalized had a abdominal CT scan that was abnormal with dilated pancreatic duct no obvious obstruction and no evidence of pancreatitis.  He was seen by gastroenterology, Dr. Candis Schatz.  He also reported hemorrhoids.  He smokes 3 to 4 cigarettes/day.  No alcohol.  Denies a prior history of pancreatitis.  By history, the chest pain did get worse when he lifted things over 100 pounds and was relieved with rest.  And MRCP was ordered to evaluate his dilated pancreatic duct.  His hemorrhoid was also treated, hematochezia.  Today, he is accompanied by a family member, and needed a virtual interpreter. His fast heart rate started a few years ago. His heart beats fast whenever he is not in a comfortable situation. He also feels some associated pain and dizziness. He last experienced this last year during the snow, and he was frightened and he felt dizzy but he kept going.  Generally, he feels uncomfortable, and states he will feel worse if he does not take the medicine.  He denies shortness of breath. No headaches, syncope, orthopnea, or PND. Also has no lower extremity edema or exertional symptoms.  ER note, lab work, EKG, data reviewed  Past Medical History:  Diagnosis Date   GERD (gastroesophageal reflux disease)    Hyperlipidemia     No past surgical history on file.  Current Medications: Current Meds  Medication Sig   aspirin EC 81 MG tablet Take 1 tablet (81 mg total) by mouth daily. Swallow whole.   diphenhydrAMINE-zinc acetate (BENADRYL EXTRA STRENGTH) cream Apply 1 application topically 3 (three) times daily as needed for  itching.   metoprolol tartrate (LOPRESSOR) 25 MG tablet Take 1 tablet (25 mg total) by mouth as directed. Take 1 tablet 2 hours before the CT scan   pantoprazole (PROTONIX) 40 MG tablet Take 1 tablet (40 mg total) by mouth daily.   rosuvastatin (CRESTOR) 10 MG tablet Take 1 tablet (10 mg total) by mouth daily.   Current Facility-Administered Medications for the 03/17/21 encounter (Office Visit) with Jerline Pain, MD  Medication   0.9 %  sodium chloride infusion     Allergies:   Patient has no known allergies.   Social History   Socioeconomic History   Marital status: Married    Spouse name: Not on file   Number of children: Not on file   Years of education: Not on file   Highest education level: Not on file  Occupational History   Not on file  Tobacco Use   Smoking status: Every Day   Smokeless tobacco: Never  Vaping Use   Vaping Use: Never used  Substance and Sexual Activity   Alcohol use: Never   Drug use: Never   Sexual activity: Not on file  Other Topics Concern   Not on file  Social History Narrative   Not on file   Social Determinants of Health   Financial Resource Strain: Not on file  Food Insecurity: Not on file  Transportation Needs: Not on file  Physical Activity: Not on file  Stress:  Not on file  Social Connections: Not on file     Family History: The patient's family history is negative for Colon cancer, Esophageal cancer, Rectal cancer, and Stomach cancer.  ROS:   Please see the history of present illness.    (+) Dizziness (+) Chest Pain (+) Palpitations All other systems reviewed and are negative.  EKGs/Labs/Other Studies Reviewed:    The following studies were reviewed today:  No prior CV studies available  EKG:  EKG is personally reviewed and interpreted.  03/17/2021: EKG not ordered today.  11/25/2020: Normal Sinus Rhythm, Rate 75 bpm Right atrial enlargement   Recent Labs: 11/25/2020: ALT 31; BUN 15; Creatinine, Ser 0.90; Hemoglobin  13.5; Platelets 345; Potassium 3.9; Sodium 138   Recent Lipid Panel    Component Value Date/Time   CHOL 206 (H) 12/28/2020 1130   TRIG 157 (H) 12/28/2020 1130   HDL 47 12/28/2020 1130   CHOLHDL 4.4 12/28/2020 1130   LDLCALC 131 (H) 12/28/2020 1130     Risk Assessment/Calculations:          Physical Exam:    VS:  BP (!) 100/50 (BP Location: Left Arm, Patient Position: Sitting, Cuff Size: Normal)   Pulse 75   Ht 5\' 4"  (1.626 m)   Wt 108 lb (49 kg)   SpO2 98%   BMI 18.54 kg/m     Wt Readings from Last 3 Encounters:  03/17/21 108 lb (49 kg)  03/01/21 111 lb 8 oz (50.6 kg)  01/27/21 114 lb (51.7 kg)     GEN: Thin in no acute distress HEENT: Normal NECK: No JVD; No carotid bruits LYMPHATICS: No lymphadenopathy CARDIAC: RRR, no murmurs, rubs, gallops RESPIRATORY:  Clear to auscultation without rales, wheezing or rhonchi  ABDOMEN: Soft, non-tender, non-distended MUSCULOSKELETAL:  No edema; No deformity  SKIN: Warm and dry NEUROLOGIC:  Alert and oriented x 3 PSYCHIATRIC:  Normal affect   ASSESSMENT:    1. Precordial pain   2. Pre-procedure lab exam    PLAN:    In order of problems listed above: Chest pain of uncertain etiology We will go ahead and check a coronary CT scan.  With his smoking history, age, hyperlipidemia he may very well have underlying coronary artery disease.  We will also check an echocardiogram to ensure proper structure and function of his heart.  I do believe however that his symptoms of racing heartbeat, feeling dizzy during stressful situations is likely anxiety driven/hyperventilation in those settings.  He reported feeling this when there was snow on his car.  Of course we will check his heart out thoroughly to make sure that this is not another manifestation of underlying heart disease but we have also instructed him on deep breathing techniques to help in the situations.  Translator assisted.   Follow-up: As needed testing.  Medication  Adjustments/Labs and Tests Ordered: Current medicines are reviewed at length with the patient today.  Concerns regarding medicines are outlined above.  Orders Placed This Encounter  Procedures   CT CORONARY MORPH W/CTA COR W/SCORE W/CA W/CM &/OR WO/CM   Basic metabolic panel   ECHOCARDIOGRAM COMPLETE     Meds ordered this encounter  Medications   metoprolol tartrate (LOPRESSOR) 25 MG tablet    Sig: Take 1 tablet (25 mg total) by mouth as directed. Take 1 tablet 2 hours before the CT scan    Dispense:  1 tablet    Refill:  0     Patient Instructions  Medication Instructions:  The current  medical regimen is effective;  continue present plan and medications.  *If you need a refill on your cardiac medications before your next appointment, please call your pharmacy*  Lab Work: Please have blood work today  (BMP)  If you have labs (blood work) drawn today and your tests are completely normal, you will receive your results only by: MyChart Message (if you have MyChart) OR A paper copy in the mail If you have any lab test that is abnormal or we need to change your treatment, we will call you to review the results.   Testing/Procedures: Your physician has requested that you have an echocardiogram. Echocardiography is a painless test that uses sound waves to create images of your heart. It provides your doctor with information about the size and shape of your heart and how well your heart's chambers and valves are working. This procedure takes approximately one hour. There are no restrictions for this procedure.    Your cardiac CT will be scheduled at one of the below locations:   [facility] [address] [place], [place] [address] [phone]  Please arrive at the [facility] main entrance (entrance A) of [facility] 30 minutes prior to test start time. You can use the FREE valet parking offered at the main entrance (encouraged to control the heart rate  for the test) Proceed to the [facility] Radiology Department (first floor) to check-in and test prep.   Please follow these instructions carefully (unless otherwise directed):  Hold all erectile dysfunction medications at least 3 days (72 hrs) prior to test.  On the Night Before the Test: Be sure to Drink plenty of water. Do not consume any caffeinated/decaffeinated beverages or chocolate 12 hours prior to your test. Do not take any antihistamines 12 hours prior to your test.  On the Day of the Test: Drink plenty of water until 1 hour prior to the test. Do not eat any food 4 hours prior to the test. You may take your regular medications prior to the test.  Take metoprolol (Lopressor) two hours prior to test. HOLD Furosemide/Hydrochlorothiazide morning of the test.  After the Test: Drink plenty of water. After receiving IV contrast, you may experience a mild flushed feeling. This is normal. On occasion, you may experience a mild rash up to 24 hours after the test. This is not dangerous. If this occurs, you can take Benadryl 25 mg and increase your fluid intake. If you experience trouble breathing, this can be serious. If it is severe call 911 IMMEDIATELY. If it is mild, please call our office  Please allow 2-4 weeks for scheduling of routine cardiac CTs. Some insurance companies require a pre-authorization which may delay scheduling of this test.   For non-scheduling related questions, please contact the cardiac imaging nurse navigator should you have any questions/concerns: Marchia Bond, Cardiac Imaging Nurse Navigator Gordy Clement, Cardiac Imaging Nurse Navigator  Heart and Vascular Services Direct Office Dial: [phone]   For scheduling needs, including cancellations and rescheduling, please call [place], [phone].  Follow-Up: At [facility], you and your health needs are our priority.  As part of our continuing mission to provide you with exceptional  heart care, we have created designated Provider Care Teams.  These Care Teams include your primary Cardiologist (physician) and Advanced Practice Providers (APPs -  Physician Assistants and Nurse Practitioners) who all work together to provide you with the care you need, when you need it.  We recommend signing up for  the patient portal called "MyChart".  Sign up information is provided on this After Visit Summary.  MyChart is used to connect with patients for Virtual Visits (Telemedicine).  Patients are able to view lab/test results, encounter notes, upcoming appointments, etc.  Non-urgent messages can be sent to your provider as well.   To learn more about what you can do with MyChart, go to [email or web address].    Your next appointment:   Follow up based on results of the above testing  Thank you for choosing [facility]!!     I,Mathew Stumpf,acting as a scribe for Candee Furbish, MD.,have documented all relevant documentation on the behalf of Candee Furbish, MD,as directed by  Candee Furbish, MD while in the presence of Candee Furbish, MD.  I, Candee Furbish, MD, have reviewed all documentation for this visit. The documentation on 03/17/21 for the exam, diagnosis, procedures, and orders are all accurate and complete.   Signed, Candee Furbish, MD  03/17/2021 5:31 PM    [place]

## 2021-03-17 NOTE — Assessment & Plan Note (Addendum)
We will go ahead and check a coronary CT scan.  With his smoking history, age, hyperlipidemia he may very well have underlying coronary artery disease.  We will also check an echocardiogram to ensure proper structure and function of his heart.  I do believe however that his symptoms of racing heartbeat, feeling dizzy during stressful situations is likely anxiety driven/hyperventilation in those settings.  He reported feeling this when there was snow on his car.  Of course we will check his heart out thoroughly to make sure that this is not another manifestation of underlying heart disease but we have also instructed him on deep breathing techniques to help in the situations.  Translator assisted.

## 2021-03-18 ENCOUNTER — Encounter: Payer: Self-pay | Admitting: *Deleted

## 2021-03-18 LAB — BASIC METABOLIC PANEL
BUN/Creatinine Ratio: 12 (ref 9–20)
BUN: 9 mg/dL (ref 6–24)
CO2: 23 mmol/L (ref 20–29)
Calcium: 9.5 mg/dL (ref 8.7–10.2)
Chloride: 106 mmol/L (ref 96–106)
Creatinine, Ser: 0.78 mg/dL (ref 0.76–1.27)
Glucose: 87 mg/dL (ref 70–99)
Potassium: 4.3 mmol/L (ref 3.5–5.2)
Sodium: 143 mmol/L (ref 134–144)
eGFR: 103 mL/min/{1.73_m2} (ref >59)

## 2021-03-22 ENCOUNTER — Ambulatory Visit (HOSPITAL_COMMUNITY): Admission: RE | Admit: 2021-03-22 | Payer: 59 | Source: Ambulatory Visit

## 2021-03-25 ENCOUNTER — Encounter: Payer: Self-pay | Admitting: Gastroenterology

## 2021-03-25 ENCOUNTER — Ambulatory Visit (INDEPENDENT_AMBULATORY_CARE_PROVIDER_SITE_OTHER): Payer: 59 | Admitting: Gastroenterology

## 2021-03-25 VITALS — BP 106/62 | HR 70 | Ht 64.0 in | Wt 110.0 lb

## 2021-03-25 DIAGNOSIS — K642 Third degree hemorrhoids: Secondary | ICD-10-CM | POA: Diagnosis not present

## 2021-03-25 NOTE — Progress Notes (Signed)
PROCEDURE NOTE: The patient presents with symptomatic grade 3  hemorrhoids, requesting rubber band ligation of his hemorrhoidal disease.  All risks, benefits and alternative forms of therapy were described and informed consent was obtained.     The anorectum was pre-medicated with topical lidocaine (5%) and nitroglycerin (0.125%) The decision was made to band the left lateral internal hemorrhoid, and the [place] was used to perform band ligation without complication.  Digital anorectal examination was then performed to assure proper positioning of the band, and to adjust the banded tissue as required.  The patient was discharged home without pain or other issues.  Dietary and behavioral recommendations were given and along with follow-up instructions.     The following adjunctive treatments were recommended:   Daily fiber supplementation with Metamucil Adequate water intake Avoidance of straining with defecation and prolonged bowel movements   The patient will return in 2 to 4 weeks for  follow-up and possible additional banding as required. No complications were encountered and the patient tolerated the procedure well.

## 2021-03-25 NOTE — Patient Instructions (Addendum)
If you are age 58 or older, your body mass index should be between 23-30. Your Body mass index is 18.88 kg/m. If this is out of the aforementioned range listed, please consider follow up with your Primary Care Provider.  If you are age 31 or younger, your body mass index should be between 19-25. Your Body mass index is 18.88 kg/m. If this is out of the aformentioned range listed, please consider follow up with your Primary Care Provider.   HEMORRHOID BANDING PROCEDURE    FOLLOW-UP CARE   The procedure you have had should have been relatively painless since the banding of the area involved does not have nerve endings and there is no pain sensation.  The rubber band cuts off the blood supply to the hemorrhoid and the band may fall off as soon as 48 hours after the banding (the band may occasionally be seen in the toilet bowl following a bowel movement). You may notice a temporary feeling of fullness in the rectum which should respond adequately to plain Tylenol or Motrin.  Following the banding, avoid strenuous exercise that evening and resume full activity the next day.  A sitz bath (soaking in a warm tub) or bidet is soothing, and can be useful for cleansing the area after bowel movements.     To avoid constipation, take two tablespoons of natural wheat bran, natural oat bran, flax, Benefiber or any over the counter fiber supplement and increase your water intake to 7-8 glasses daily.    Unless you have been prescribed anorectal medication, do not put anything inside your rectum for two weeks: No suppositories, enemas, fingers, etc.  Occasionally, you may have more bleeding than usual after the banding procedure.  This is often from the untreated hemorrhoids rather than the treated one.  Don't be concerned if there is a tablespoon or so of blood.  If there is more blood than this, lie flat with your bottom higher than your head and apply an ice pack to the area. If the bleeding does not stop  within a half an hour or if you feel faint, call our office at [phone] or go to the emergency room.  Problems are not common; however, if there is a substantial amount of bleeding, severe pain, chills, fever or difficulty passing urine (very rare) or other problems, you should call us at [phone] or report to the nearest emergency room.  Do not stay seated continuously for more than 2-3 hours for a day or two after the procedure.  Tighten your buttock muscles 10-15 times every two hours and take 10-15 deep breaths every 1-2 hours.  Do not spend more than a few minutes on the toilet if you cannot empty your bowel; instead re-visit the toilet at a later time.     The [facility] providers would like to encourage you to use [facility] to communicate with providers for non-urgent requests or questions.  Due to long hold times on the telephone, sending your provider a message by [facility] may be a faster and more efficient way to get a response.  Please allow 48 business hours for a response.  Please remember that this is for non-urgent requests.   It was a pleasure to see you today!  Thank you for trusting me with your gastrointestinal care!    Scott E. Candis Schatz , MD

## 2021-04-14 ENCOUNTER — Other Ambulatory Visit: Payer: Self-pay

## 2021-04-14 ENCOUNTER — Ambulatory Visit (HOSPITAL_COMMUNITY): Payer: 59 | Attending: Cardiology

## 2021-04-14 DIAGNOSIS — R072 Precordial pain: Secondary | ICD-10-CM

## 2021-04-14 LAB — ECHOCARDIOGRAM COMPLETE
Area-P 1/2: 3.38 cm2
S' Lateral: 2.2 cm

## 2021-04-19 ENCOUNTER — Encounter: Payer: Self-pay | Admitting: Gastroenterology

## 2021-04-19 ENCOUNTER — Ambulatory Visit (INDEPENDENT_AMBULATORY_CARE_PROVIDER_SITE_OTHER): Payer: 59 | Admitting: Gastroenterology

## 2021-04-19 ENCOUNTER — Other Ambulatory Visit: Payer: Medicaid Other

## 2021-04-19 VITALS — BP 106/78 | HR 90 | Ht 64.0 in | Wt 110.0 lb

## 2021-04-19 DIAGNOSIS — K642 Third degree hemorrhoids: Secondary | ICD-10-CM

## 2021-04-19 NOTE — Progress Notes (Signed)
PROCEDURE NOTE: The patient presents with symptomatic grade 3 hemorrhoids, requesting rubber band ligation of his/her hemorrhoidal disease.  All risks, benefits and alternative forms of therapy were described and informed consent was obtained.   The anorectum was pre-medicated with topical lidocaine (5%) and nitroglycerin (0.125%) The decision was made to band the right posterior internal hemorrhoid, and the [place] was used to perform band ligation without complication.  Digital anorectal examination was then performed to assure proper positioning of the band, and to adjust the banded tissue as required.  Digital examination revealed that the banded hemorrhoid tissue was scant and unlikely to be very effective.  Decision was made to attempt banding of the right anterior hemorrhoid, and then repeat right posterior at a later time.  Digital examination of the right anterior hemorrhoid appropriate amount of banded hemorrhoid tissue.  The patient was discharged home without pain or other issues.  Dietary and behavioral recommendations were given and along with follow-up instructions.     The following adjunctive treatments were recommended:  Daily fiber supplementation with Metamucil Adequate water intake Avoidance of straining with defecation and prolonged bowel movements  The patient will return in 2-4 weeks for  follow-up and possible additional banding as required. No complications were encountered and the patient tolerated the procedure well.

## 2021-04-19 NOTE — Patient Instructions (Addendum)
If you are age 58 or older, your body mass index should be between 23-30. Your Body mass index is 18.88 kg/m. If this is out of the aforementioned range listed, please consider follow up with your Primary Care Provider.  If you are age 41 or younger, your body mass index should be between 19-25. Your Body mass index is 18.88 kg/m. If this is out of the aformentioned range listed, please consider follow up with your Primary Care Provider.   Your provider has requested that you go to the basement level for lab work before leaving today. Press "B" on the elevator. The lab is located at the first door on the left as you exit the elevator.   Stop Protonix 2-3 weeks prior to giving sample.   Please purchase Metamucil over the counter. Take as directed.   You have been scheduled for an MRI at [facility]  on 04/22/21. Your appointment time is 9am. Please arrive to admitting (at main entrance of the hospital) 15 minutes prior to your appointment time for registration purposes. Please make certain not to have anything to eat or drink 6 hours prior to your test. In addition, if you have any metal in your body, have a pacemaker or defibrillator, please be sure to let your ordering physician know. This test typically takes 45 minutes to 1 hour to complete. Should you need to reschedule, please call [phone] to do so.  The [facility] providers would like to encourage you to use [facility] to communicate with providers for non-urgent requests or questions.  Due to long hold times on the telephone, sending your provider a message by [facility] may be a faster and more efficient way to get a response.  Please allow 48 business hours for a response.  Please remember that this is for non-urgent requests.   It was a pleasure to see you today!  Thank you for trusting me with your gastrointestinal care!    Scott E.Candis Schatz, MD

## 2021-04-21 ENCOUNTER — Other Ambulatory Visit: Payer: Self-pay | Admitting: Gastroenterology

## 2021-04-21 ENCOUNTER — Ambulatory Visit (HOSPITAL_COMMUNITY): Admission: RE | Admit: 2021-04-21 | Payer: 59 | Source: Ambulatory Visit

## 2021-04-21 DIAGNOSIS — R079 Chest pain, unspecified: Secondary | ICD-10-CM

## 2021-04-21 DIAGNOSIS — Z1211 Encounter for screening for malignant neoplasm of colon: Secondary | ICD-10-CM

## 2021-04-22 ENCOUNTER — Telehealth: Payer: Self-pay

## 2021-04-22 ENCOUNTER — Ambulatory Visit: Payer: 59 | Admitting: Gastroenterology

## 2021-04-22 NOTE — Telephone Encounter (Signed)
Called patient and let him know that he was scheduled for 04/21/21 not 12/9 and that it was a typo on his after visit summary. Patient has been rescheduled for 12/22 @ 9am.

## 2021-05-03 ENCOUNTER — Other Ambulatory Visit: Payer: Medicaid Other

## 2021-05-03 DIAGNOSIS — K642 Third degree hemorrhoids: Secondary | ICD-10-CM

## 2021-05-05 ENCOUNTER — Other Ambulatory Visit: Payer: Self-pay

## 2021-05-05 ENCOUNTER — Ambulatory Visit (HOSPITAL_COMMUNITY)
Admission: RE | Admit: 2021-05-05 | Discharge: 2021-05-05 | Disposition: A | Discharge status: Home / Self Care | Payer: 59 | Source: Ambulatory Visit | Attending: Gastroenterology | Admitting: Gastroenterology

## 2021-05-05 DIAGNOSIS — Z1212 Encounter for screening for malignant neoplasm of rectum: Secondary | ICD-10-CM | POA: Insufficient documentation

## 2021-05-05 DIAGNOSIS — R079 Chest pain, unspecified: Secondary | ICD-10-CM | POA: Diagnosis present

## 2021-05-05 DIAGNOSIS — Z1211 Encounter for screening for malignant neoplasm of colon: Secondary | ICD-10-CM | POA: Diagnosis present

## 2021-05-05 LAB — H. PYLORI ANTIGEN, STOOL: H pylori Ag, Stl: NEGATIVE

## 2021-05-05 IMAGING — MR MR 3D RECON AT [facility]NER
18 of 20 series · 42 of 48 positions shown · IV contrast (5ml GADAVIST)
Comparison: None.

CLINICAL DATA: Chest pain history of isolated pancreatic ductal
dilation.



[Series 3: DWI · axial · 6.0mm · 1.49mm/px · z∈[-84,+168]mm · 3 of 72 slices shown (1 of 2)]
[im 1/72]
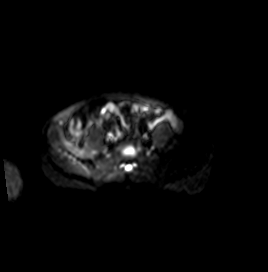
[im 36/72]
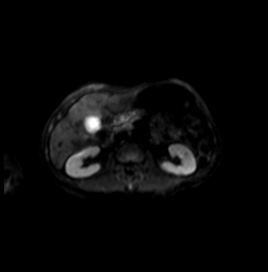
[im 72/72]
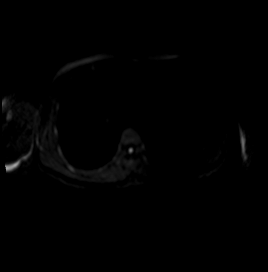

[Series 4: DWI · axial · 6.0mm · 1.49mm/px · 1 of 36 slices shown (2 of 2)]
[im 1/36]
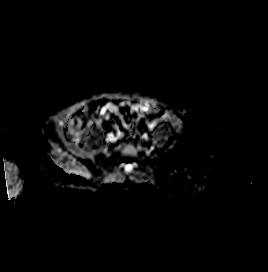

[Series 5: T2 fat-sat · axial · 6.0mm · 1.17mm/px · 1 of 32 slices shown]
[im 1/32]
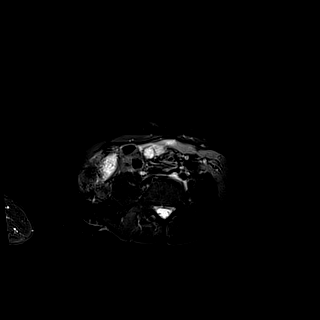

[Series 8: T2 · coronal · 6.0mm · 1.48mm/px · 1 of 30 slices shown (1 of 2)]
[im 1/30]
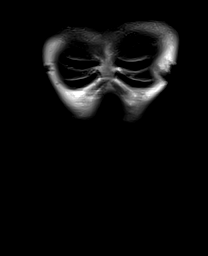

[Series 10: T1 · axial · 3.0mm · 1.17mm/px · z∈[-50,+163]mm · 3 of 72 slices shown (1 of 2)]
[im 1/72]
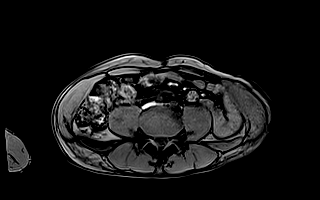
[im 36/72]
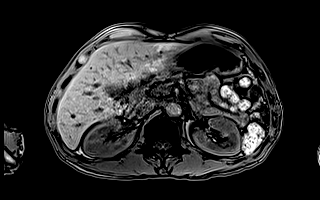
[im 72/72]
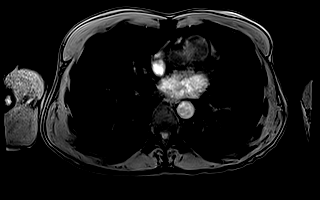

[Series 11: T1 · axial · 3.0mm · 1.17mm/px · z∈[-50,+163]mm · 3 of 72 slices shown (2 of 2)]
[im 1/72]
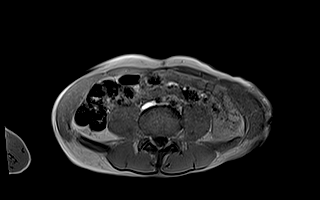
[im 36/72]
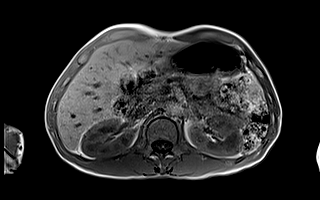
[im 72/72]
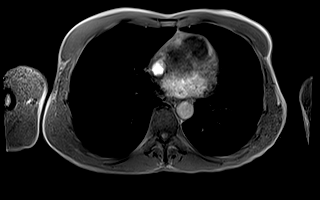

[Series 13: cor_3d_spc_trig · coronal · 1.0mm · 0.49mm/px · 3 of 72 slices shown]
[im 1/72]
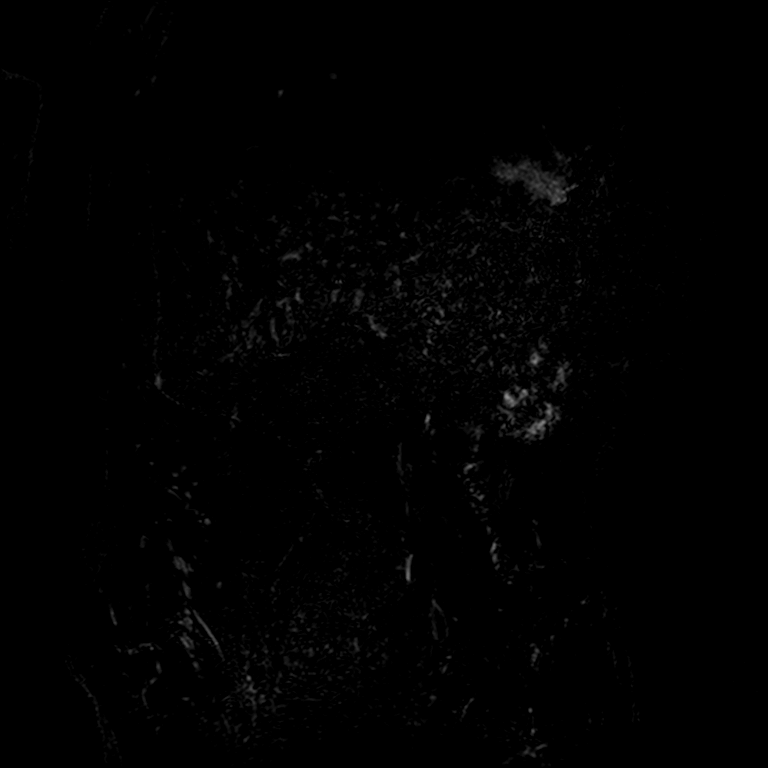
[im 36/72]
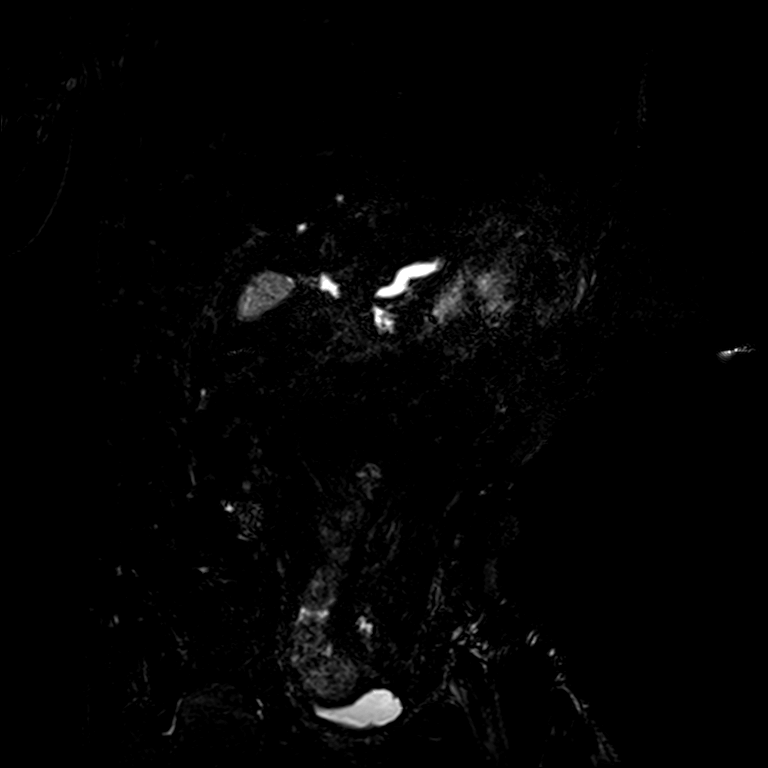
[im 72/72]
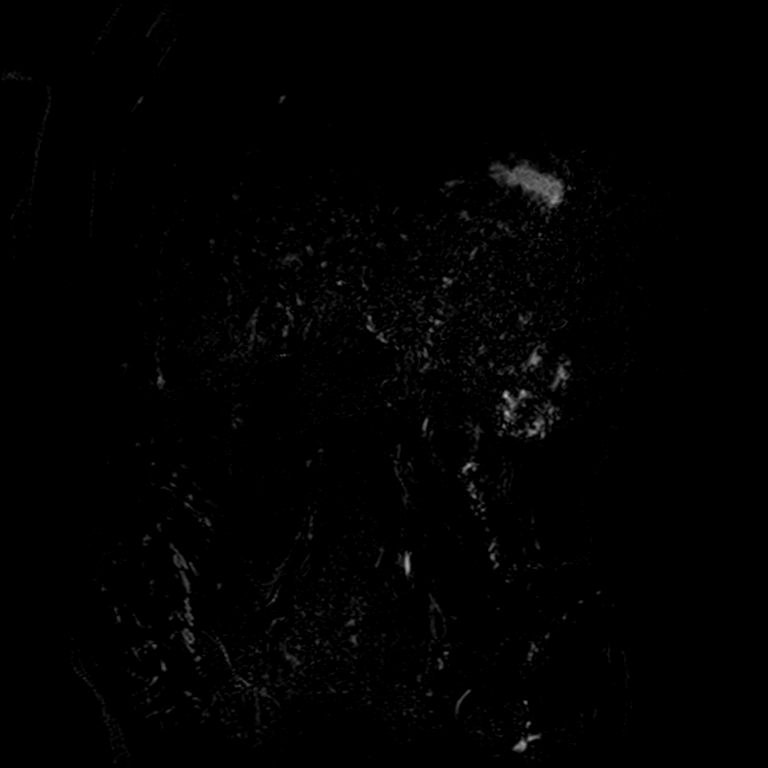

[Series 15: cor obl thk · sagittal · 50.0mm · 0.78mm/px · 1 of 8 slices shown]
[im 1/8]
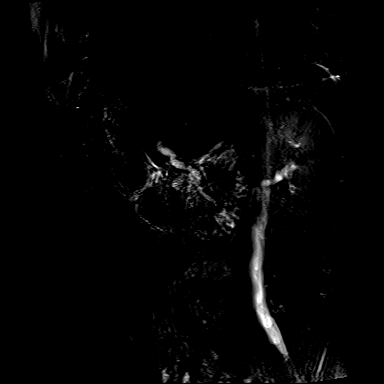

[Series 17: T2 · axial · 6.0mm · 1.41mm/px · 1 of 35 slices shown (2 of 2)]
[im 1/35]
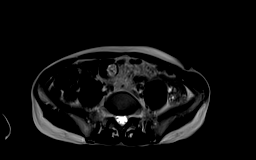

[Series 19: T1 dynamic · axial · 3.0mm · 1.14mm/px · z∈[-71,+166]mm · 3 of 80 slices shown (1 of 9)]
[im 1/80]
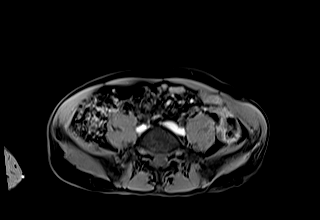
[im 40/80]
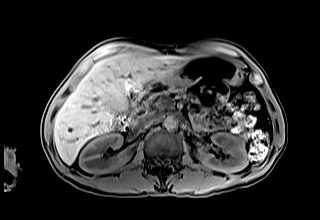
[im 80/80]
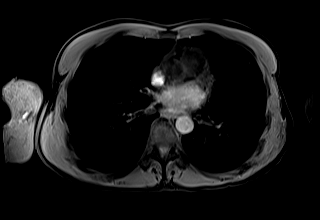

[Series 23: T1 dynamic · axial · 3.0mm · 1.14mm/px · z∈[-71,+166]mm · 3 of 80 slices shown (2 of 9)]
[im 1/80]
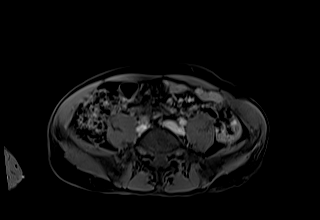
[im 40/80]
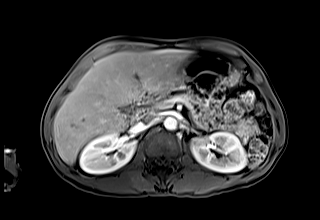
[im 80/80]
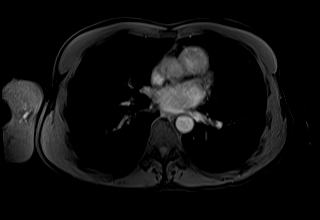

[Series 24: T1 dynamic · axial · 3.0mm · 1.14mm/px · z∈[-71,+166]mm · 3 of 80 slices shown (3 of 9)]
[im 1/80]
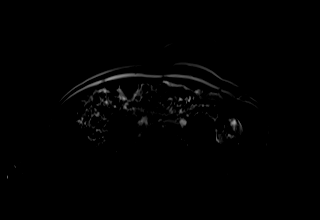
[im 40/80]
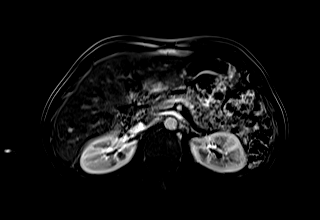
[im 80/80]
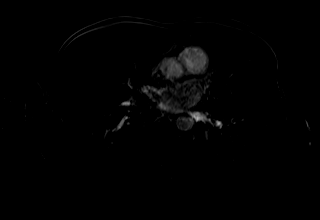

[Series 27: T1 dynamic · axial · 3.0mm · 1.14mm/px · z∈[-71,+166]mm · 3 of 80 slices shown (4 of 9)]
[im 1/80]
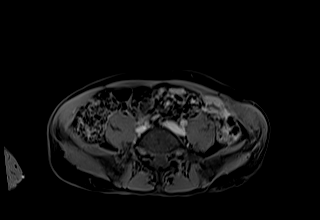
[im 40/80]
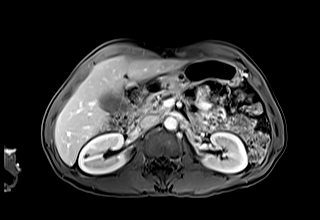
[im 80/80]
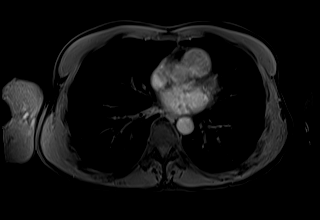

[Series 28: T1 dynamic · axial · 3.0mm · 1.14mm/px · z∈[-71,+166]mm · 3 of 80 slices shown (5 of 9)]
[im 1/80]
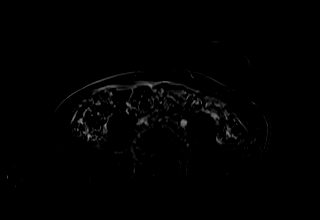
[im 40/80]
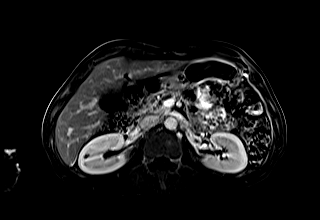
[im 80/80]
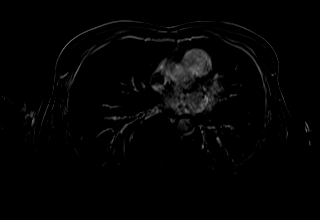

[Series 31: T1 dynamic · axial · 3.0mm · 1.14mm/px · z∈[-71,+166]mm · 3 of 80 slices shown (6 of 9)]
[im 1/80]
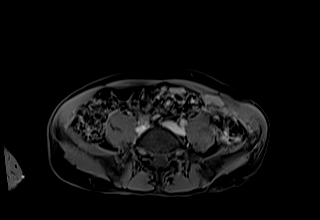
[im 40/80]
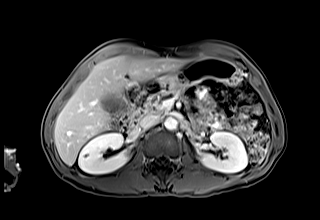
[im 80/80]
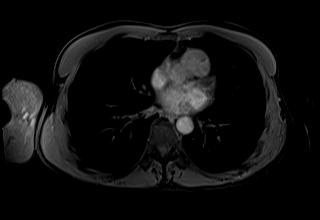

[Series 32: T1 dynamic · axial · 3.0mm · 1.14mm/px · z∈[-71,+166]mm · 3 of 80 slices shown (7 of 9)]
[im 1/80]
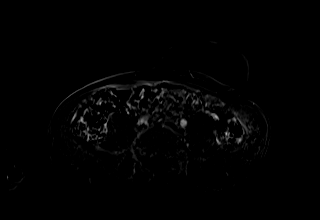
[im 40/80]
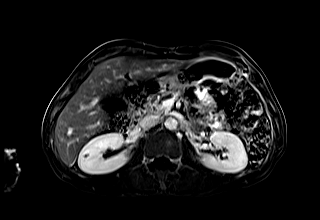
[im 80/80]
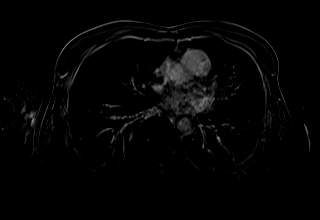

[Series 34: T1 dynamic · coronal · 4.0mm · 1.18mm/px · 2 of 56 slices shown (8 of 9)]
[im 1/56]
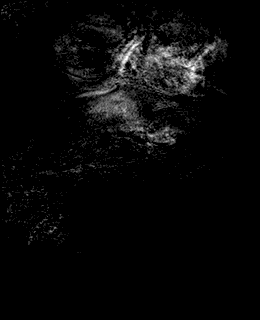
[im 56/56]
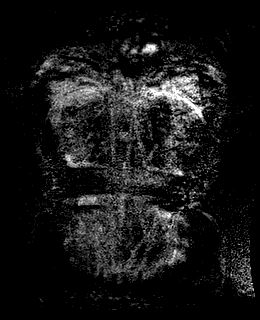

[Series 37: T1 dynamic · axial · 3.0mm · 1.14mm/px · z∈[-71,+46]mm · 2 of 80 slices shown (9 of 9)]
[im 1/80]
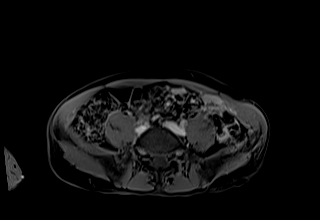
[im 40/80]
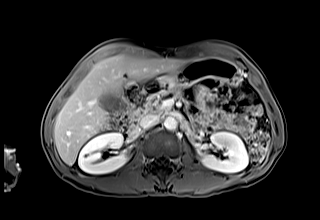

[42 of 48 positions shown; findings below may reference images not displayed]

FINDINGS: Lower chest: Incidental imaging of the lung bases is unremarkable.

Hepatobiliary: No focal, suspicious hepatic lesion. No signs of
liver disease or fatty infiltration. Sludge in the gallbladder lumen
without signs of pericholecystic stranding. No biliary duct
dilation.

Pancreas: Pancreatic atrophy and signs of ductal dilation most
pronounced peripheral to the pancreatic neck and dilated up to 6 mm
greatest caliber. Mild side branch ectasia particularly in the area
of ductal transition extending into ventral pancreas in this
location as well. No gross filling defect to suggest intraductal
calculus. Subtle internal enhancement within the duct along the
course of the main pancreatic duct (image 45/23) near the site of
transition though there is also some mild distension of the main
pancreatic duct beyond this location. Subtle low signal is seen
surrounding the duct in the area of the pancreatic neck adjacent to
the site of transition (image 44/23).

Spleen:  Normal.

Adrenals/Urinary Tract: Adrenal glands are normal. No suspicious
renal lesion or hydronephrosis. No perinephric stranding.

Stomach/Bowel: Unremarkable to the extent evaluated on abdominal
MRI.

Vascular/Lymphatic: No pathologically enlarged lymph nodes
identified. No abdominal aortic aneurysm demonstrated.

Other:  No ascites.

Musculoskeletal: No suspicious bone lesions identified.
IMPRESSION: Pancreatic atrophy and signs of ductal dilation mainly in the dorsal
pancreatic duct. Question of internal enhancement and subtle low
signal about the site of most pronounced ductal transition. Findings
are suspicious for small occult lesion or main duct intraductal
papillary mucinous neoplasm complicated by early malignant
transformation. Would suggest endoscopic ultrasound for further
evaluation.

No upper abdominal lymphadenopathy.  No suspicious hepatic lesion.

These results will be called to the ordering clinician or
representative by the Radiologist Assistant, and communication
documented in the PACS or [REDACTED].

## 2021-05-05 IMAGING — MR MR ABDOMEN WO/W CM MRCP
18 of 21 series · 42 of 48 positions shown · IV contrast (gadavist)
Comparison: None.

CLINICAL DATA: Chest pain history of isolated pancreatic ductal
dilation.



[Series 2: DWI · axial · 6.0mm · 1.49mm/px · z∈[-84,+168]mm · 3 of 72 slices shown (1 of 2)]
[im 1/72]
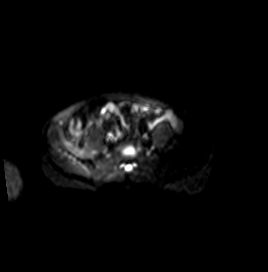
[im 36/72]
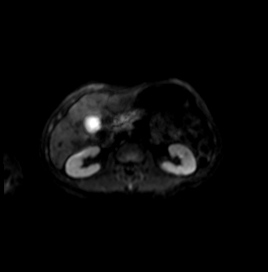
[im 72/72]
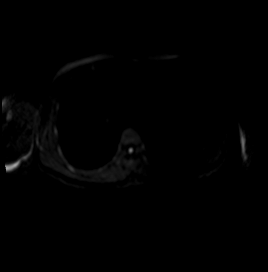

[Series 3: DWI · axial · 6.0mm · 1.49mm/px · 1 of 36 slices shown (2 of 2)]
[im 1/36]
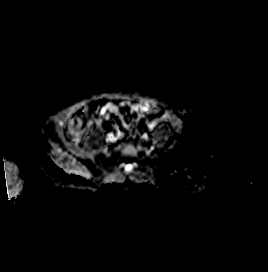

[Series 4: T2 fat-sat · axial · 6.0mm · 1.17mm/px · 1 of 32 slices shown]
[im 1/32]
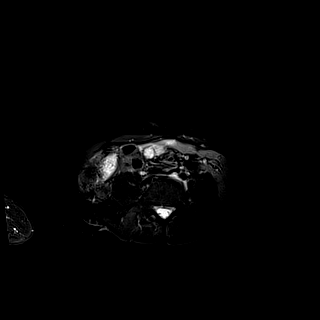

[Series 7: T2 · coronal · 6.0mm · 1.48mm/px · 1 of 30 slices shown (1 of 2)]
[im 1/30]
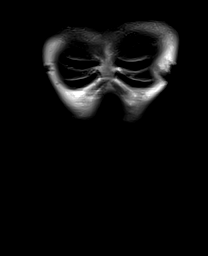

[Series 9: T1 · axial · 3.0mm · 1.17mm/px · z∈[-50,+163]mm · 3 of 72 slices shown (1 of 2)]
[im 1/72]
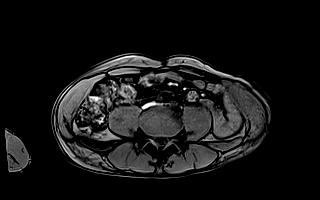
[im 36/72]
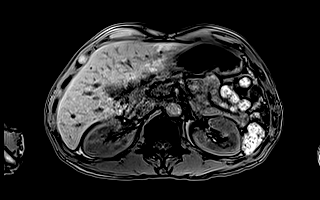
[im 72/72]
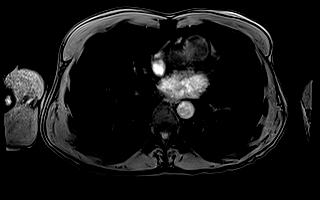

[Series 10: T1 · axial · 3.0mm · 1.17mm/px · z∈[-50,+163]mm · 3 of 72 slices shown (2 of 2)]
[im 1/72]
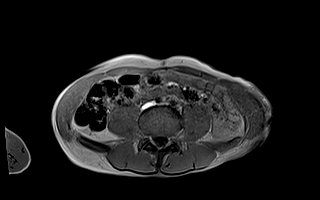
[im 36/72]
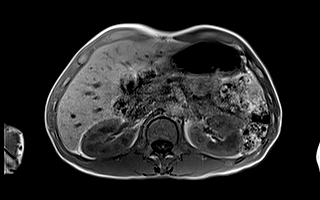
[im 72/72]
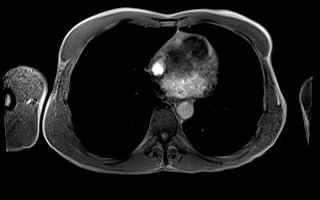

[Series 13: cor_3d_spc_trig · coronal · 1.0mm · 0.49mm/px · 3 of 72 slices shown]
[im 1/72]
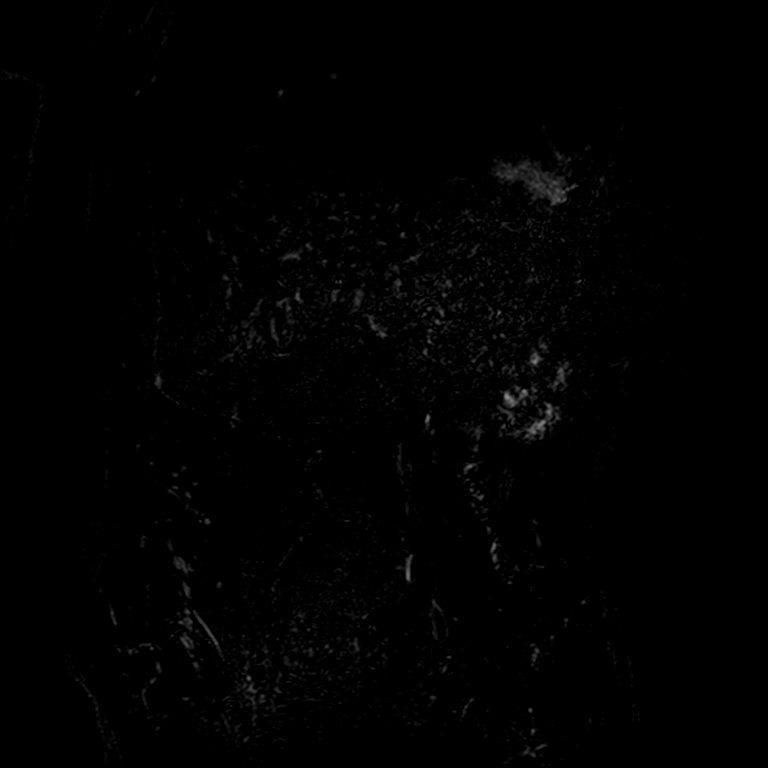
[im 36/72]
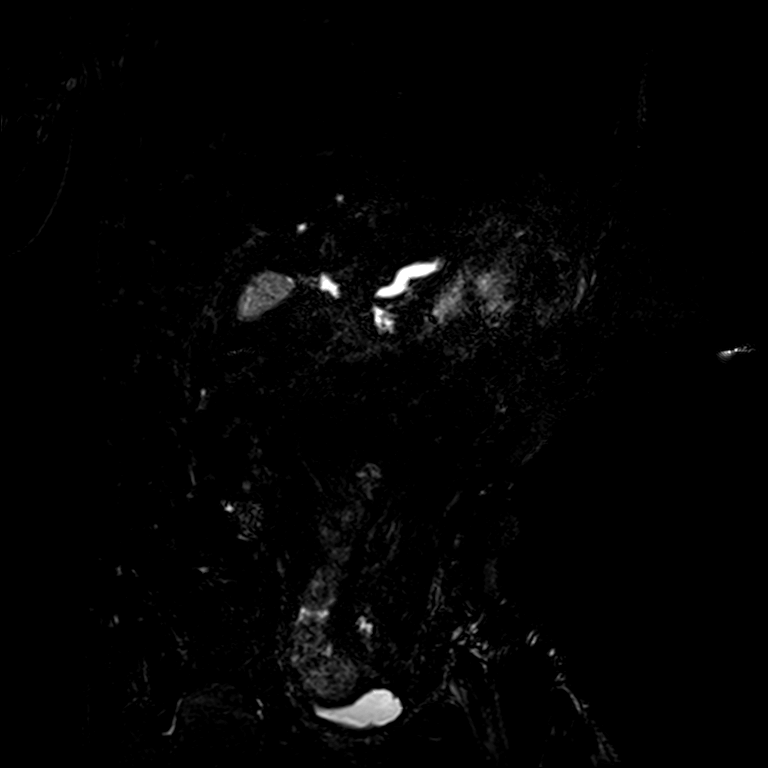
[im 72/72]
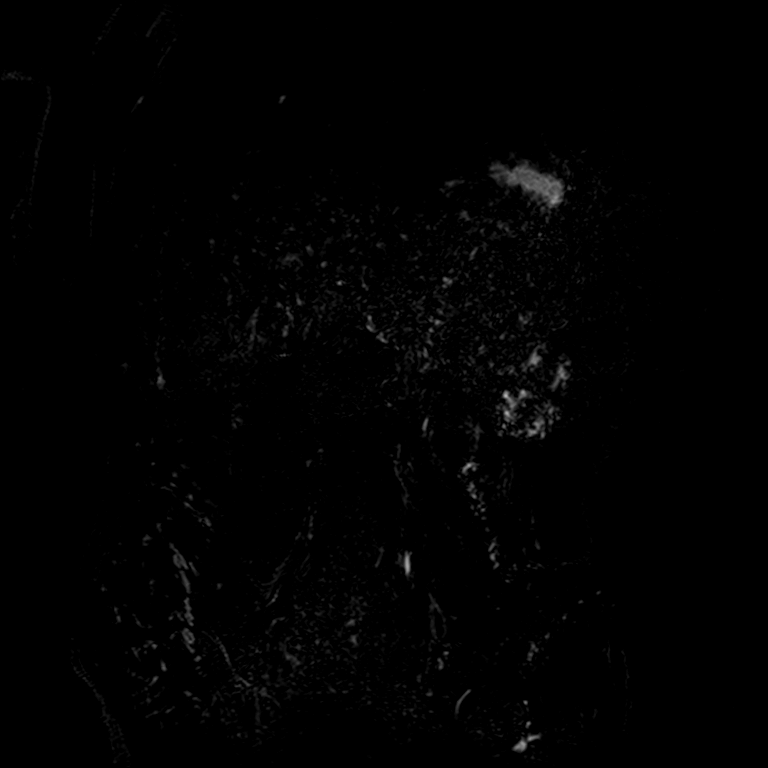

[Series 15: cor obl thk · sagittal · 50.0mm · 0.78mm/px · 1 of 8 slices shown]
[im 1/8]
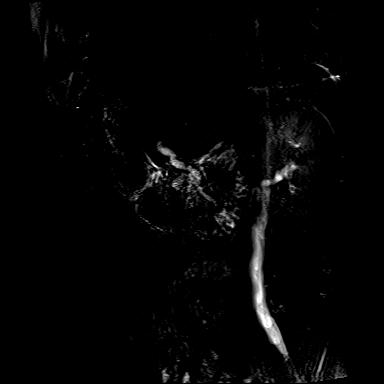

[Series 17: T2 · axial · 6.0mm · 1.41mm/px · 1 of 35 slices shown (2 of 2)]
[im 1/35]
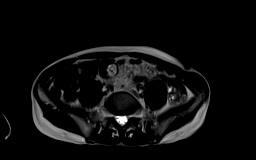

[Series 19: T1 dynamic · axial · 3.0mm · 1.14mm/px · z∈[-71,+166]mm · 3 of 80 slices shown (1 of 9)]
[im 1/80]
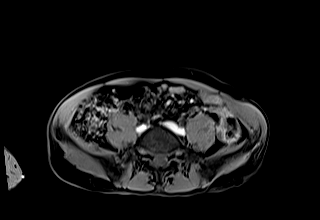
[im 40/80]
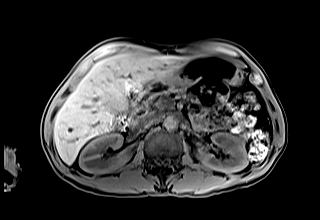
[im 80/80]
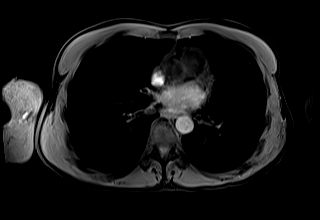

[Series 23: T1 dynamic · axial · 3.0mm · 1.14mm/px · z∈[-71,+166]mm · 3 of 80 slices shown (2 of 9)]
[im 1/80]
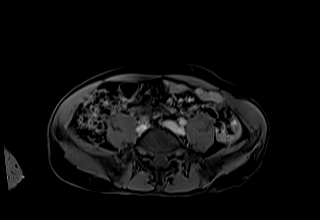
[im 40/80]
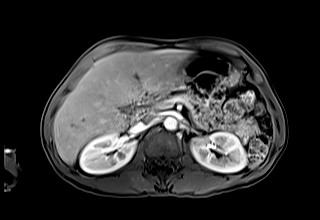
[im 80/80]
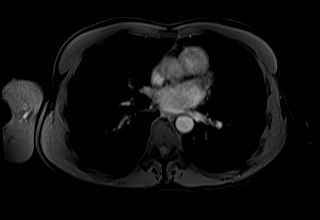

[Series 24: T1 dynamic · axial · 3.0mm · 1.14mm/px · z∈[-71,+166]mm · 3 of 80 slices shown (3 of 9)]
[im 1/80]
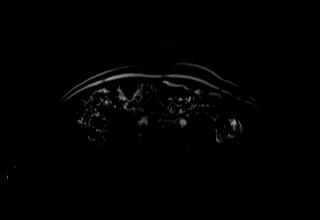
[im 40/80]
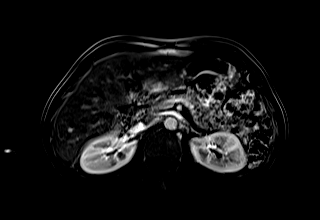
[im 80/80]
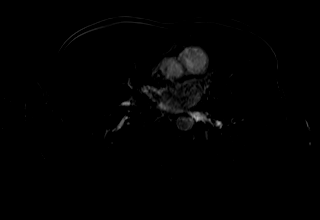

[Series 27: T1 dynamic · axial · 3.0mm · 1.14mm/px · z∈[-71,+166]mm · 3 of 80 slices shown (4 of 9)]
[im 1/80]
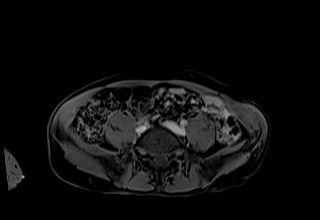
[im 40/80]
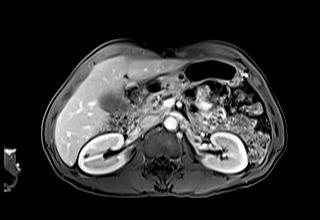
[im 80/80]
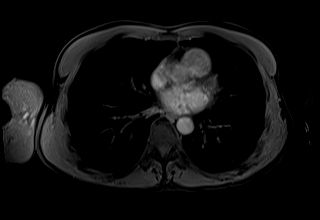

[Series 28: T1 dynamic · axial · 3.0mm · 1.14mm/px · z∈[-71,+166]mm · 3 of 80 slices shown (5 of 9)]
[im 1/80]
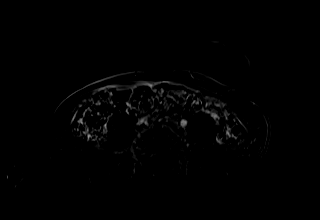
[im 40/80]
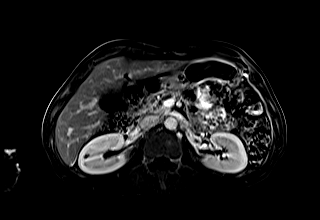
[im 80/80]
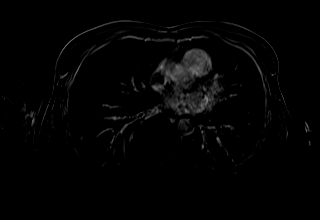

[Series 31: T1 dynamic · axial · 3.0mm · 1.14mm/px · z∈[-71,+166]mm · 3 of 80 slices shown (6 of 9)]
[im 1/80]
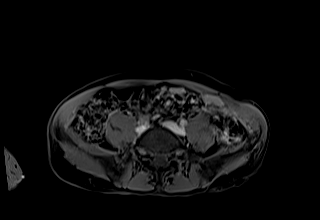
[im 40/80]
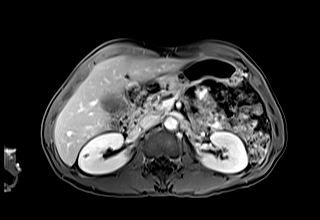
[im 80/80]
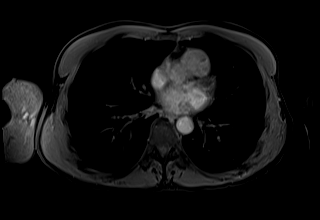

[Series 32: T1 dynamic · axial · 3.0mm · 1.14mm/px · z∈[-71,+166]mm · 3 of 80 slices shown (7 of 9)]
[im 1/80]
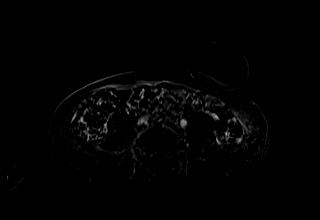
[im 40/80]
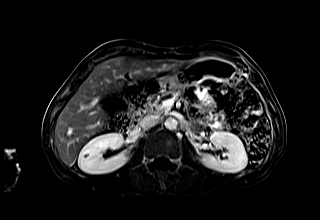
[im 80/80]
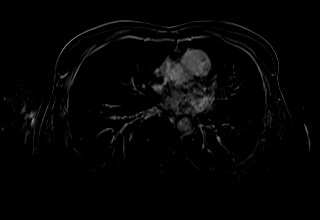

[Series 34: T1 dynamic · coronal · 4.0mm · 1.18mm/px · 2 of 56 slices shown (8 of 9)]
[im 1/56]
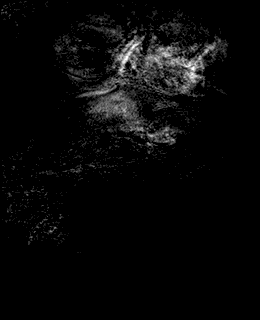
[im 56/56]
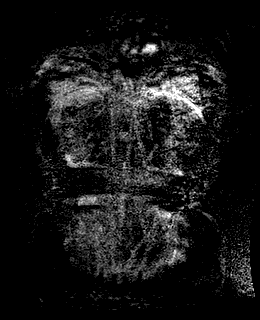

[Series 37: T1 dynamic · axial · 3.0mm · 1.14mm/px · z∈[-71,+46]mm · 2 of 80 slices shown (9 of 9)]
[im 1/80]
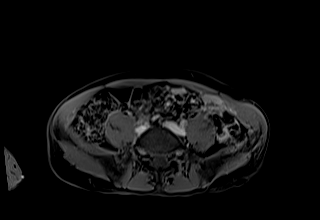
[im 40/80]
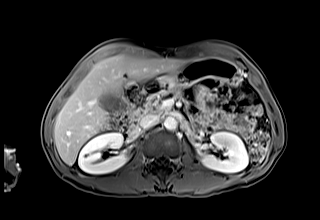

[42 of 48 positions shown; findings below may reference images not displayed]

FINDINGS: Lower chest: Incidental imaging of the lung bases is unremarkable.

Hepatobiliary: No focal, suspicious hepatic lesion. No signs of
liver disease or fatty infiltration. Sludge in the gallbladder lumen
without signs of pericholecystic stranding. No biliary duct
dilation.

Pancreas: Pancreatic atrophy and signs of ductal dilation most
pronounced peripheral to the pancreatic neck and dilated up to 6 mm
greatest caliber. Mild side branch ectasia particularly in the area
of ductal transition extending into ventral pancreas in this
location as well. No gross filling defect to suggest intraductal
calculus. Subtle internal enhancement within the duct along the
course of the main pancreatic duct (image 45/23) near the site of
transition though there is also some mild distension of the main
pancreatic duct beyond this location. Subtle low signal is seen
surrounding the duct in the area of the pancreatic neck adjacent to
the site of transition (image 44/23).

Spleen:  Normal.

Adrenals/Urinary Tract: Adrenal glands are normal. No suspicious
renal lesion or hydronephrosis. No perinephric stranding.

Stomach/Bowel: Unremarkable to the extent evaluated on abdominal
MRI.

Vascular/Lymphatic: No pathologically enlarged lymph nodes
identified. No abdominal aortic aneurysm demonstrated.

Other:  No ascites.

Musculoskeletal: No suspicious bone lesions identified.
IMPRESSION: Pancreatic atrophy and signs of ductal dilation mainly in the dorsal
pancreatic duct. Question of internal enhancement and subtle low
signal about the site of most pronounced ductal transition. Findings
are suspicious for small occult lesion or main duct intraductal
papillary mucinous neoplasm complicated by early malignant
transformation. Would suggest endoscopic ultrasound for further
evaluation.

No upper abdominal lymphadenopathy.  No suspicious hepatic lesion.

These results will be called to the ordering clinician or
representative by the Radiologist Assistant, and communication
documented in the PACS or [REDACTED].

## 2021-05-05 MED ORDER — GADOBUTROL 1 MMOL/ML IV SOLN
5.0000 mL | Freq: Once | INTRAVENOUS | Status: AC | PRN
  Administered 2021-05-05: 10:00:00 5 mL via INTRAVENOUS

## 2021-05-06 ENCOUNTER — Telehealth: Payer: Self-pay

## 2021-05-06 NOTE — Telephone Encounter (Signed)
Please see notes below.

## 2021-05-06 NOTE — Telephone Encounter (Signed)
[name], This patient will need endoscopic ultrasound.  This can be arranged by Dr. Candis Schatz upon his return. Thanks Dr.

## 2021-05-06 NOTE — Telephone Encounter (Signed)
Received call report late yesterday afternoon but did not have access to report in epic until this am. Chest pain history of isolated pancreatic ductal dilation.Cunningham pt. Please see below as DOD and advise.  IMPRESSION: Pancreatic atrophy and signs of ductal dilation mainly in the dorsal pancreatic duct. Question of internal enhancement and subtle low signal about the site of most pronounced ductal transition. Findings are suspicious for small occult lesion or main duct intraductal papillary mucinous neoplasm complicated by early malignant transformation. Would suggest endoscopic ultrasound for further evaluation.   No upper abdominal lymphadenopathy.  No suspicious hepatic lesion.   These results will be called to the ordering clinician or representative by the Radiologist Assistant, and communication documented in the PACS or [employer]

## 2021-05-10 ENCOUNTER — Encounter: Payer: Self-pay | Admitting: *Deleted

## 2021-05-11 ENCOUNTER — Other Ambulatory Visit: Payer: Self-pay

## 2021-05-11 DIAGNOSIS — K8689 Other specified diseases of pancreas: Secondary | ICD-10-CM

## 2021-05-11 NOTE — Telephone Encounter (Signed)
SEC, Reasonable to consider EUS based on progressive dilation and some area of truncation. Our availability for EUS is quite tight. Patty, please offer first available EGD/EUS Linear FNA/FNB with DJ or myself. Let Dr. Candis Schatz know when it is scheduled. Thanks. GM

## 2021-05-11 NOTE — Telephone Encounter (Signed)
EUS EGD scheduled with DJ at [facility] on 2/23 at 730 am

## 2021-05-11 NOTE — Telephone Encounter (Signed)
Left message on machine to call back  

## 2021-05-12 NOTE — Telephone Encounter (Signed)
I will put him on the list so that if we have any cancellations I will call.  Dr Rush Landmark any other place you would like to try and add this pt on?

## 2021-05-12 NOTE — Telephone Encounter (Signed)
Try to make the case a 45 minute case and put on 1/16 at 200PM.  Thanks. GM

## 2021-05-12 NOTE — Telephone Encounter (Signed)
I called scheduling and they tell me that the 2 pm case is blocked and says "no cases"

## 2021-05-12 NOTE — Telephone Encounter (Signed)
Patty, that is weird and I did not know why that is blocked. Here are the 2 options: 1) on 05/17/2020, we can cancel that patient who was on there because she has left AMA and he can be added there at 730 2) on 1/19, the patient can be added at 1145/12 PM for procedure  Please let me know which of those the patient decides to do. Thanks. GM

## 2021-05-12 NOTE — Telephone Encounter (Signed)
Left message on machine to call back  

## 2021-05-13 NOTE — Telephone Encounter (Signed)
Patient has been scheduled on 06/02/21 at [facility] at 12 pm. Pt will need to arrive at 10:30 am.  Patty, please contact pt with new appt information. Thanks

## 2021-05-17 ENCOUNTER — Ambulatory Visit (INDEPENDENT_AMBULATORY_CARE_PROVIDER_SITE_OTHER): Payer: Self-pay | Admitting: Gastroenterology

## 2021-05-17 ENCOUNTER — Encounter: Payer: Self-pay | Admitting: Gastroenterology

## 2021-05-17 VITALS — BP 100/74 | HR 82 | Ht 64.0 in | Wt 107.0 lb

## 2021-05-17 DIAGNOSIS — K642 Third degree hemorrhoids: Secondary | ICD-10-CM

## 2021-05-17 NOTE — Telephone Encounter (Signed)
The pt was seen by Dr Candis Schatz this morning and was given all the information for the upcoming hospital case on 1/19.

## 2021-05-17 NOTE — Patient Instructions (Signed)
If you are age 59 or older, your body mass index should be between 23-30. Your Body mass index is 18.37 kg/m. If this is out of the aforementioned range listed, please consider follow up with your Primary Care Provider.  If you are age 1 or younger, your body mass index should be between 19-25. Your Body mass index is 18.37 kg/m. If this is out of the aformentioned range listed, please consider follow up with your Primary Care Provider.  You have been scheduled for an endoscopy. Please follow written instructions given to you at your visit today. If you use inhalers (even only as needed), please bring them with you on the day of your procedure.   HEMORRHOID BANDING PROCEDURE    FOLLOW-UP CARE   The procedure you have had should have been relatively painless since the banding of the area involved does not have nerve endings and there is no pain sensation.  The rubber band cuts off the blood supply to the hemorrhoid and the band may fall off as soon as 48 hours after the banding (the band may occasionally be seen in the toilet bowl following a bowel movement). You may notice a temporary feeling of fullness in the rectum which should respond adequately to plain Tylenol or Motrin.  Following the banding, avoid strenuous exercise that evening and resume full activity the next day.  A sitz bath (soaking in a warm tub) or bidet is soothing, and can be useful for cleansing the area after bowel movements.     To avoid constipation, take two tablespoons of natural wheat bran, natural oat bran, flax, Benefiber or any over the counter fiber supplement and increase your water intake to 7-8 glasses daily.    Unless you have been prescribed anorectal medication, do not put anything inside your rectum for two weeks: No suppositories, enemas, fingers, etc.  Occasionally, you may have more bleeding than usual after the banding procedure.  This is often from the untreated hemorrhoids rather than the treated  one.  Dont be concerned if there is a tablespoon or so of blood.  If there is more blood than this, lie flat with your bottom higher than your head and apply an ice pack to the area. If the bleeding does not stop within a half an hour or if you feel faint, call our office at [phone] or go to the emergency room.  Problems are not common; however, if there is a substantial amount of bleeding, severe pain, chills, fever or difficulty passing urine (very rare) or other problems, you should call us at [phone] or report to the nearest emergency room.  Do not stay seated continuously for more than 2-3 hours for a day or two after the procedure.  Tighten your buttock muscles 10-15 times every two hours and take 10-15 deep breaths every 1-2 hours.  Do not spend more than a few minutes on the toilet if you cannot empty your bowel; instead re-visit the toilet at a later time.     The [facility] providers would like to encourage you to use [facility] to communicate with providers for non-urgent requests or questions.  Due to long hold times on the telephone, sending your provider a message by [facility] may be a faster and more efficient way to get a response.  Please allow 48 business hours for a response.  Please remember that this is for non-urgent requests.   It was a pleasure to see you today!  Thank  you for trusting me with your gastrointestinal care!    Scott E.Candis Schatz, MD

## 2021-05-17 NOTE — Progress Notes (Signed)
PROCEDURE NOTE: The patient presents with symptomatic grade 3 hemorrhoids, requesting rubber band ligation of his/her hemorrhoidal disease.  All risks, benefits and alternative forms of therapy were described and informed consent was obtained. The patient reports no benefit in terms of his bleeding and prolapsing following his most recent banding.   The anorectum was pre-medicated with topical lidocaine (5%) and nitroglycerin (0.125%) The decision was made to band the right anterior internal hemorrhoid, and the Tucker was used to perform band ligation without complication.  Digital anorectal examination was then performed to assure proper positioning of the band, and to adjust the banded tissue as required.  The patient was discharged home without pain or other issues.  Dietary and behavioral recommendations were given and along with follow-up instructions.     The following adjunctive treatments were recommended:  Daily fiber supplementation with Metamucil Adequate water intake Avoidance of straining with defecation and prolonged bowel movements  The patient will return in 2 to 4 weeks for  follow-up and possible additional banding as required.  Would likely repeat banding of the right posterior hemorrhoid column next time. No complications were encountered and the patient tolerated the procedure well.

## 2021-05-24 ENCOUNTER — Encounter (HOSPITAL_COMMUNITY): Payer: Self-pay | Admitting: Gastroenterology

## 2021-05-25 NOTE — Progress Notes (Signed)
**  Using Interpretive services I attempted to obtain medical history via telephone, unable to reach at this time. I left a voicemail to return pre surgical testing department's phone call.

## 2021-06-02 ENCOUNTER — Ambulatory Visit (HOSPITAL_COMMUNITY)
Admission: RE | Admit: 2021-06-02 | Discharge: 2021-06-02 | Disposition: A | Discharge status: Home / Self Care | Payer: 59 | Source: Ambulatory Visit | Attending: Gastroenterology | Admitting: Gastroenterology

## 2021-06-02 ENCOUNTER — Other Ambulatory Visit: Payer: Self-pay

## 2021-06-02 ENCOUNTER — Ambulatory Visit (HOSPITAL_COMMUNITY): Payer: 59 | Admitting: Anesthesiology

## 2021-06-02 ENCOUNTER — Encounter (HOSPITAL_COMMUNITY): Payer: Self-pay | Admitting: Gastroenterology

## 2021-06-02 ENCOUNTER — Encounter (HOSPITAL_COMMUNITY)
Admission: RE | Disposition: A | Discharge status: Home / Self Care | Payer: Self-pay | Source: Ambulatory Visit | Attending: Gastroenterology

## 2021-06-02 ENCOUNTER — Encounter (HOSPITAL_COMMUNITY): Payer: Self-pay

## 2021-06-02 DIAGNOSIS — K828 Other specified diseases of gallbladder: Secondary | ICD-10-CM

## 2021-06-02 DIAGNOSIS — K838 Other specified diseases of biliary tract: Secondary | ICD-10-CM | POA: Insufficient documentation

## 2021-06-02 DIAGNOSIS — E785 Hyperlipidemia, unspecified: Secondary | ICD-10-CM | POA: Diagnosis not present

## 2021-06-02 DIAGNOSIS — K8689 Other specified diseases of pancreas: Secondary | ICD-10-CM | POA: Diagnosis not present

## 2021-06-02 DIAGNOSIS — K219 Gastro-esophageal reflux disease without esophagitis: Secondary | ICD-10-CM | POA: Insufficient documentation

## 2021-06-02 DIAGNOSIS — K869 Disease of pancreas, unspecified: Secondary | ICD-10-CM

## 2021-06-02 DIAGNOSIS — F17200 Nicotine dependence, unspecified, uncomplicated: Secondary | ICD-10-CM | POA: Diagnosis not present

## 2021-06-02 HISTORY — PX: UPPER ESOPHAGEAL ENDOSCOPIC ULTRASOUND (EUS): SHX6562

## 2021-06-02 HISTORY — PX: ESOPHAGOGASTRODUODENOSCOPY (EGD) WITH PROPOFOL: SHX5813

## 2021-06-02 SURGERY — ESOPHAGOGASTRODUODENOSCOPY (EGD) WITH PROPOFOL
Anesthesia: Monitor Anesthesia Care

## 2021-06-02 MED ORDER — LACTATED RINGERS IV SOLN: INTRAVENOUS | Status: DC | PRN

## 2021-06-02 MED ORDER — PANTOPRAZOLE SODIUM 40 MG PO TBEC: 40.0000 mg | DELAYED_RELEASE_TABLET | Freq: Two times a day (BID) | ORAL | 6 refills | Status: DC

## 2021-06-02 MED ORDER — SODIUM CHLORIDE 0.9 % IV SOLN: INTRAVENOUS | Status: DC

## 2021-06-02 MED ORDER — PROPOFOL 500 MG/50ML IV EMUL
INTRAVENOUS | Status: DC | PRN
  Administered 2021-06-02: 150 ug/kg/min via INTRAVENOUS

## 2021-06-02 MED ORDER — LIDOCAINE 2% (20 MG/ML) 5 ML SYRINGE
INTRAMUSCULAR | Status: DC | PRN
  Administered 2021-06-02: 40 mg via INTRAVENOUS

## 2021-06-02 MED ORDER — PROPOFOL 10 MG/ML IV BOLUS
INTRAVENOUS | Status: DC | PRN
Start: 2021-06-02 — End: 2021-06-02
  Administered 2021-06-02 (×2): 20 mg via INTRAVENOUS

## 2021-06-02 MED ORDER — PHENYLEPHRINE 40 MCG/ML (10ML) SYRINGE FOR IV PUSH (FOR BLOOD PRESSURE SUPPORT)
PREFILLED_SYRINGE | INTRAVENOUS | Status: DC | PRN
  Administered 2021-06-02: 80 ug via INTRAVENOUS
  Administered 2021-06-02: 200 ug via INTRAVENOUS

## 2021-06-02 SURGICAL SUPPLY — 15 items

## 2021-06-02 NOTE — Transfer of Care (Signed)
Immediate Anesthesia Transfer of Care Note  Patient: Jack Moss  Procedure(s) Performed: ESOPHAGOGASTRODUODENOSCOPY (EGD) WITH PROPOFOL UPPER ESOPHAGEAL ENDOSCOPIC ULTRASOUND (EUS)  Patient Location: Endoscopy Unit  Anesthesia Type:MAC  Level of Consciousness: drowsy  Airway & Oxygen Therapy: Patient Spontanous Breathing and Patient connected to nasal cannula oxygen  Post-op Assessment: Report given to RN and Post -op Vital signs reviewed and stable  Post vital signs: Reviewed and stable  Last Vitals:  Vitals Value Taken Time  BP 116/70 06/02/21 1341  Temp    Pulse 57 06/02/21 1342  Resp 12 06/02/21 1342  SpO2 99 % 06/02/21 1342  Vitals shown include unvalidated device data.  Last Pain:  Vitals:   06/02/21 1056  TempSrc: Oral  PainSc: 0-No pain         Complications: No notable events documented.

## 2021-06-02 NOTE — Op Note (Signed)
[facility] Patient Name: Jack Moss Procedure Date : 06/02/2021 MRN: [id number] Attending MD: Justice Britain , MD Date of Birth: [birth year] CSN: [id number] Age: 61 Admit Type: Outpatient Procedure:                Upper EUS Indications:              Dilated pancreatic duct on MRCP Providers:                Justice Britain, MD, Doristine Johns, RN, [facility] Technician, Technician Referring MD:             Gladstone Pih. Candis Schatz, MD, Gifford Shave Medicines:                Monitored Anesthesia Care Complications:            No immediate complications. Estimated Blood Loss:     Estimated blood loss was minimal. Estimated blood                            loss: none. Procedure:                Pre-Anesthesia Assessment:                           - Prior to the procedure, a History and Physical                            was performed, and patient medications and                            allergies were reviewed. The patient's tolerance of                            previous anesthesia was also reviewed. The risks                            and benefits of the procedure and the sedation                            options and risks were discussed with the patient.                            All questions were answered, and informed consent                            was obtained. Prior Anticoagulants: The patient has                            taken no previous anticoagulant or antiplatelet                            agents. ASA Grade Assessment: II - A patient with  mild systemic disease. After reviewing the risks                            and benefits, the patient was deemed in                            satisfactory condition to undergo the procedure.                           After obtaining informed consent, the endoscope was                            passed under direct vision. Throughout the                             procedure, the patient's blood pressure, pulse, and                            oxygen saturations were monitored continuously. The                            [id number]-H190 ([id number]) Olympus endoscope was introduced                            through the mouth, and advanced to the second part                            of duodenum. The [id number]-[id number] ([id number]) Olympus                            duodenoscope was introduced through the mouth, and                            advanced to the area of papilla. The [id number]                            ([id number]) Olympus linear ultrasound scope was                            introduced through the mouth, and advanced to the                            duodenum for ultrasound examination from the                            stomach and duodenum. The upper EUS was                            accomplished without difficulty. The patient                            tolerated the procedure. Scope In: Scope Out: Findings:      ENDOSCOPIC FINDING: :      The examined esophagus was normal.  The Z-line was regular and was found 37 cm from the incisors.      No gross lesions were noted in the entire examined stomach.      No gross lesions were noted in the duodenal bulb, in the first portion       of the duodenum and in the second portion of the duodenum.      The major papilla was normal.      ENDOSONOGRAPHIC FINDING: :      Pancreatic parenchymal abnormalities were noted in the entire pancreas.       These consisted of lobularity without honeycombing and hyperechoic       strands.      The pancreatic duct had a tortuous/ectatic appearance in the genu of the       pancreas.      The pancreatic duct in the pancreatic head (2.3 mm), there was a       transition and dilation within the genu of the pancreas (5.0 mm -> 4.5       mm), body of the pancreas (3.4 mm -> 3.3 mm) and tail of the pancreas       (2.2 mm).      Endosonographic imaging in the entire  pancreas showed no mass-lesion       throughout and specifically at the area of transition within the neck of       the pancreas.      There was no sign of significant endosonographic abnormality in the       common bile duct (4.6 mm) and in the common hepatic duct. Ducts of       normal caliber were identified.      A small amount of hyperechoic material consistent with sludge was       visualized endosonographically in the gallbladder.      A hypoechoic polyp was identified endosonographically in the gallbladder.      Endosonographic imaging of the ampulla showed no mass.      Endosonographic imaging in the visualized portion of the liver showed no       mass.      No malignant-appearing lymph nodes were visualized in the celiac region       (level 20), peripancreatic region and porta hepatis region.      The celiac region was visualized. Impression:               EGD Impression:                           - Normal esophagus. Z-line regular, 37 cm from the                            incisors.                           - No gross lesions in the stomach.                           - No gross lesions in the duodenal bulb, in the                            first portion of the duodenum and in the second  portion of the duodenum.                           - Normal major papilla.                           EUS Impression:                           - Pancreatic parenchymal abnormalities consisting                            of lobularity and hyperechoic strands were noted in                            the entire pancreas.                           - The pancreatic duct had a tortuous/ectatic                            appearance in the genu of the pancreas.                           - The pancreatic duct in the pancreatic head, genu                            of the pancreas, body of the pancreas and tail of                            the pancreas.                            - Changes within pancreas suggestive of chronic                            pancreatitis but not definite based on [place]                            criteria.                           - No evidence of a mass/lesion noted in pancreas.                           - There was no sign of significant pathology in the                            common bile duct and in the common hepatic duct.                           - Hyperechoic material consistent with sludge was                            visualized endosonographically in the gallbladder.                           -  A polyp was found in the gallbladder.                           - No malignant-appearing lymph nodes were                            visualized in the celiac region (level 20),                            peripancreatic region and porta hepatis region. Recommendation:           - The patient will be observed post-procedure,                            until all discharge criteria are met.                           - Discharge patient to home.                           - Monitor for signs/symptoms of bleeding,                            perforation, and infection. If issues please call                            our number to get further assistance as needed.                           - Resume previous diet.                           - Observe patient's clinical course.                           - Plan for CA19-9 to be obtained in the next few                            weeks at office.                           - Tentative plan for repeat MRI/MRCP in 2-3 months.                           - If CA19-9 is elevated, I will recommend repeat                            EUS and likely going ahead and trying to biopsy the                            area of transition in the pancreatic neck though                            overt mass/lesion not noted.                           -  Plan to increase PPI to twice daily at this time                             to see for next few weeks if that helps with his                            atypical upper chest discomfort.                           - Query additional workup such as Manometry v pH                            impedence testing as per primary GI.                           - The findings and recommendations were discussed                            with the patient.                           - The findings and recommendations were discussed                            with the patient's family. Procedure Code(s):        --- Professional ---                           [phone], Esophagogastroduodenoscopy, flexible,                            transoral; with endoscopic ultrasound examination                            limited to the esophagus, stomach or duodenum, and                            adjacent structures Diagnosis Code(s):        --- Professional ---                           K86.9, Disease of pancreas, unspecified                           K82.8, Other specified diseases of gallbladder                           I89.9, Noninfective disorder of lymphatic vessels                            and lymph nodes, unspecified                           K86.89, Other specified diseases of pancreas  R93.3, Abnormal findings on diagnostic imaging of                            other parts of digestive tract                           K83.8, Other specified diseases of biliary tract CPT copyright 2019 American Medical Association. All rights reserved. The codes documented in this report are preliminary and upon coder review may  be revised to meet current compliance requirements. Justice Britain, MD 06/02/2021 2:04:54 PM Number of Addenda: 0

## 2021-06-02 NOTE — H&P (Signed)
GASTROENTEROLOGY PROCEDURE H&P NOTE   Primary Care Physician: Gifford Shave, MD  HPI: Jack Moss is a 61 y.o. male who presents for EGD/EUS to evaluate the pancreatic duct abnormality noted on recent cross-sectional imaging - query IPMN v stricture v mass.  Past Medical History:  Diagnosis Date   GERD (gastroesophageal reflux disease)    Hyperlipidemia    History reviewed. No pertinent surgical history. Current Facility-Administered Medications  Medication Dose Route Frequency Provider Last Rate Last Admin   0.9 %  sodium chloride infusion   Intravenous Continuous Milus Banister, MD        Current Facility-Administered Medications:    0.9 %  sodium chloride infusion, , Intravenous, Continuous, Milus Banister, MD No Known Allergies Family History  Problem Relation Age of Onset   Colon cancer Neg Hx    Esophageal cancer Neg Hx    Rectal cancer Neg Hx    Stomach cancer Neg Hx    Social History   Socioeconomic History   Marital status: Married    Spouse name: Not on file   Number of children: Not on file   Years of education: Not on file   Highest education level: Not on file  Occupational History   Not on file  Tobacco Use   Smoking status: Every Day   Smokeless tobacco: Never  Vaping Use   Vaping Use: Never used  Substance and Sexual Activity   Alcohol use: Never   Drug use: Never   Sexual activity: Not on file  Other Topics Concern   Not on file  Social History Narrative   Not on file   Social Determinants of Health   Financial Resource Strain: Not on file  Food Insecurity: Not on file  Transportation Needs: Not on file  Physical Activity: Not on file  Stress: Not on file  Social Connections: Not on file  Intimate Partner Violence: Not on file    Physical Exam: Today's Vitals   06/02/21 1056  BP: 137/71  Pulse: 78  Resp: (!) 21  Temp: 98.7 F (37.1 C)  TempSrc: Oral  SpO2: 99%  Weight: 49 kg  Height: 5\' 4"  (1.626 m)  PainSc: 0-No  pain   Body mass index is 18.54 kg/m. GEN: NAD EYE: Sclerae anicteric ENT: MMM CV: Non-tachycardic GI: Soft, NT/ND NEURO:  Alert & Oriented x 3  Lab Results: No results for input(s): WBC, HGB, HCT, PLT in the last 72 hours. BMET No results for input(s): NA, K, CL, CO2, GLUCOSE, BUN, CREATININE, CALCIUM in the last 72 hours. LFT No results for input(s): PROT, ALBUMIN, AST, ALT, ALKPHOS, BILITOT, BILIDIR, IBILI in the last 72 hours. PT/INR No results for input(s): LABPROT, INR in the last 72 hours.   Impression / Plan: This is a 61 y.o.male who presents for EGD/EUS to evaluate the pancreatic duct abnormality noted on recent cross-sectional imaging - query IPMN v stricture v mass.  The risks of an EUS including intestinal perforation, bleeding, infection, aspiration, and medication effects were discussed as was the possibility it may not give a definitive diagnosis if a biopsy is performed.  When a biopsy of the pancreas is done as part of the EUS, there is an additional risk of pancreatitis at the rate of about 1-2%.  It was explained that procedure related pancreatitis is typically mild, although it can be severe and even life threatening, which is why we do not perform random pancreatic biopsies and only biopsy a lesion/area we feel is  concerning enough to warrant the risk.   The risks and benefits of endoscopic evaluation/treatment were discussed with the patient and/or family; these include but are not limited to the risk of perforation, infection, bleeding, missed lesions, lack of diagnosis, severe illness requiring hospitalization, as well as anesthesia and sedation related illnesses.  The patient's history has been reviewed, patient examined, no change in status, and deemed stable for procedure.  The patient and/or family is agreeable to proceed.    Justice Britain, MD [facility] Office # [phone]

## 2021-06-02 NOTE — Discharge Instructions (Signed)
YOU HAD AN ENDOSCOPIC PROCEDURE TODAY: Refer to the procedure report and other information in the discharge instructions given to you for any specific questions about what was found during the examination. If this information does not answer your questions, please call [place] office at [phone] to clarify.  ° °YOU SHOULD EXPECT: Some feelings of bloating in the abdomen. Passage of more gas than usual. Walking can help get rid of the air that was put into your GI tract during the procedure and reduce the bloating. If you had a lower endoscopy (such as a colonoscopy or flexible sigmoidoscopy) you may notice spotting of blood in your stool or on the toilet paper. Some abdominal soreness may be present for a day or two, also. ° °DIET: Your first meal following the procedure should be a light meal and then it is ok to progress to your normal diet. A half-sandwich or bowl of soup is an example of a good first meal. Heavy or fried foods are harder to digest and may make you feel nauseous or bloated. Drink plenty of fluids but you should avoid alcoholic beverages for 24 hours. If you had a esophageal dilation, please see attached instructions for diet.   ° °ACTIVITY: Your care partner should take you home directly after the procedure. You should plan to take it easy, moving slowly for the rest of the day. You can resume normal activity the day after the procedure however YOU SHOULD NOT DRIVE, use power tools, machinery or perform tasks that involve climbing or major physical exertion for 24 hours (because of the sedation medicines used during the test).  ° °SYMPTOMS TO REPORT IMMEDIATELY: °A gastroenterologist can be reached at any hour. Please call [phone]  for any of the following symptoms:  °Following lower endoscopy (colonoscopy, flexible sigmoidoscopy) °Excessive amounts of blood in the stool  °Significant tenderness, worsening of abdominal pains  °Swelling of the abdomen that is new, acute  °Fever of 100° or  higher  °Following upper endoscopy (EGD, EUS, ERCP, esophageal dilation) °Vomiting of blood or coffee ground material  °New, significant abdominal pain  °New, significant chest pain or pain under the shoulder blades  °Painful or persistently difficult swallowing  °New shortness of breath  °Black, tarry-looking or red, bloody stools ° °FOLLOW UP:  °If any biopsies were taken you will be contacted by phone or by letter within the next 1-3 weeks. Call [phone]  if you have not heard about the biopsies in 3 weeks.  °Please also call with any specific questions about appointments or follow up tests. ° °

## 2021-06-02 NOTE — Anesthesia Procedure Notes (Signed)
Procedure Name: MAC Date/Time: 06/02/2021 1:00 PM Performed by: Leonor Liv, CRNA Pre-anesthesia Checklist: Patient identified, Emergency Drugs available, Patient being monitored, Suction available and Timeout performed Patient Re-evaluated:Patient Re-evaluated prior to induction Oxygen Delivery Method: Nasal cannula Airway Equipment and Method: Bite block Placement Confirmation: positive ETCO2 Dental Injury: Teeth and Oropharynx as per pre-operative assessment

## 2021-06-02 NOTE — Anesthesia Postprocedure Evaluation (Signed)
Anesthesia Post Note  Patient: Jack Moss  Procedure(s) Performed: ESOPHAGOGASTRODUODENOSCOPY (EGD) WITH PROPOFOL UPPER ESOPHAGEAL ENDOSCOPIC ULTRASOUND (EUS)     Patient location during evaluation: PACU Anesthesia Type: MAC Level of consciousness: awake and alert Pain management: pain level controlled Vital Signs Assessment: post-procedure vital signs reviewed and stable Respiratory status: spontaneous breathing, nonlabored ventilation and respiratory function stable Cardiovascular status: stable and blood pressure returned to baseline Anesthetic complications: no   No notable events documented.  Last Vitals:  Vitals:   06/02/21 1400 06/02/21 1410  BP: (!) 126/59 126/77  Pulse: 68 78  Resp: 18 18  Temp:    SpO2: 100% 100%    Last Pain:  Vitals:   06/02/21 1056  TempSrc: Oral  PainSc: 0-No pain                 Audry Pili

## 2021-06-02 NOTE — Anesthesia Preprocedure Evaluation (Addendum)
Anesthesia Evaluation  Patient identified by MRN, date of birth, ID band Patient awake    Reviewed: Allergy & Precautions, NPO status , Patient's Chart, lab work & pertinent test results  Airway Mallampati: II  TM Distance: >3 FB Neck ROM: Full    Dental no notable dental hx.    Pulmonary neg pulmonary ROS, Current Smoker,    Pulmonary exam normal breath sounds clear to auscultation       Cardiovascular negative cardio ROS Normal cardiovascular exam Rhythm:Regular Rate:Normal     Neuro/Psych negative neurological ROS  negative psych ROS   GI/Hepatic Neg liver ROS, GERD  Medicated,  Endo/Other  hyperlipidemia  Renal/GU negative Renal ROS  negative genitourinary   Musculoskeletal negative musculoskeletal ROS (+)   Abdominal   Peds negative pediatric ROS (+)  Hematology negative hematology ROS (+)   Anesthesia Other Findings   Reproductive/Obstetrics negative OB ROS                             Anesthesia Physical Anesthesia Plan  ASA: 2  Anesthesia Plan: MAC   Post-op Pain Management:    Induction: Intravenous  PONV Risk Score and Plan: 1 and Treatment may vary due to age or medical condition, Propofol infusion and TIVA  Airway Management Planned: Simple Face Mask and Natural Airway  Additional Equipment:   Intra-op Plan:   Post-operative Plan:   Informed Consent: I have reviewed the patients History and Physical, chart, labs and discussed the procedure including the risks, benefits and alternatives for the proposed anesthesia with the patient or authorized representative who has indicated his/her understanding and acceptance.     Dental advisory given and Interpreter used for interveiw  Plan Discussed with: CRNA, Anesthesiologist and Surgeon  Anesthesia Plan Comments:         Anesthesia Quick Evaluation

## 2021-06-03 ENCOUNTER — Encounter (HOSPITAL_COMMUNITY): Payer: Self-pay | Admitting: Gastroenterology

## 2021-06-03 ENCOUNTER — Telehealth: Payer: Self-pay

## 2021-06-03 DIAGNOSIS — K8689 Other specified diseases of pancreas: Secondary | ICD-10-CM

## 2021-06-03 NOTE — Telephone Encounter (Signed)
Lab order entered Left message on machine to call back

## 2021-06-03 NOTE — Telephone Encounter (Signed)
-----   Message from Irving Copas., MD sent at 06/03/2021  6:23 AM EST ----- Regarding: Follow-up SEC, You can see my EUS report. Not see any overt mass/lesion at the area of transition in the pancreas duct of the neck.  This helps me feel somewhat comfortable but we can never not be on comfortable when we see significant pancreatic duct dilation.  He may have some signs of chronic pancreatitis but has no history of alcohol consumption. My plan is the following, lets plan in the next couple of weeks for the patient to come in for a CA 19-9. As long as that CA 19-9 is normal then the plan would be repeat an MRI/MRCP at a 67-month interval from his last one to evaluate how the pancreas duct looks at that time. If the patient's CA 19-9 is elevated, then we can work on planning a repeat EUS and I would just go ahead and sample that area of transition even though there was not any mass or lesion. Also, this is not causing the patient's upper abdominal atypical chest pain.  That is his biggest concern.  I spent a good amount of time talking with him and his wife about it.  We decided to increase his PPI to twice daily since he had some improvement to see if that makes any difference.  I wonder if pH impedance testing +/- manometry could be helpful since he has had a negative cardiac work-up I believe. Happy to be of assistance in the near future.  Chrishana Spargur, please place the CA 19-9 order for the patient.  Once his CA 19-9 has returned we can work on scheduling the MRI versus repeat EUS.  Thanks. GM

## 2021-06-06 ENCOUNTER — Encounter: Payer: Medicaid Other | Admitting: Gastroenterology

## 2021-06-06 NOTE — Telephone Encounter (Signed)
Left message on machine to call back using interpreter service  

## 2021-06-07 NOTE — Telephone Encounter (Signed)
I have been unable to reach the pt by phone using interpreter service.  Will mail a letter

## 2021-07-05 ENCOUNTER — Encounter: Payer: Self-pay | Admitting: Gastroenterology

## 2021-07-05 ENCOUNTER — Ambulatory Visit (INDEPENDENT_AMBULATORY_CARE_PROVIDER_SITE_OTHER): Payer: 59 | Admitting: Gastroenterology

## 2021-07-05 ENCOUNTER — Other Ambulatory Visit: Payer: 59

## 2021-07-05 VITALS — BP 122/60 | HR 82 | Ht 64.0 in | Wt 109.2 lb

## 2021-07-05 DIAGNOSIS — K642 Third degree hemorrhoids: Secondary | ICD-10-CM | POA: Diagnosis not present

## 2021-07-05 DIAGNOSIS — K8689 Other specified diseases of pancreas: Secondary | ICD-10-CM

## 2021-07-05 NOTE — Patient Instructions (Signed)
If you are age 59 or older, your body mass index should be between 23-30. Your Body mass index is 18.74 kg/m. If this is out of the aforementioned range listed, please consider follow up with your Primary Care Provider.  If you are age 28 or younger, your body mass index should be between 19-25. Your Body mass index is 18.74 kg/m. If this is out of the aformentioned range listed, please consider follow up with your Primary Care Provider.   Your provider has requested that you go to the basement level for lab work before leaving today. Press "B" on the elevator. The lab is located at the first door on the left as you exit the elevator.  Continue Pantoprazole twice daily.  HEMORRHOID BANDING PROCEDURE    FOLLOW-UP CARE   The procedure you have had should have been relatively painless since the banding of the area involved does not have nerve endings and there is no pain sensation.  The rubber band cuts off the blood supply to the hemorrhoid and the band may fall off as soon as 48 hours after the banding (the band may occasionally be seen in the toilet bowl following a bowel movement). You may notice a temporary feeling of fullness in the rectum which should respond adequately to plain Tylenol or Motrin.  Following the banding, avoid strenuous exercise that evening and resume full activity the next day.  A sitz bath (soaking in a warm tub) or bidet is soothing, and can be useful for cleansing the area after bowel movements.     To avoid constipation, take two tablespoons of natural wheat bran, natural oat bran, flax, Benefiber or any over the counter fiber supplement and increase your water intake to 7-8 glasses daily.    Unless you have been prescribed anorectal medication, do not put anything inside your rectum for two weeks: No suppositories, enemas, fingers, etc.  Occasionally, you may have more bleeding than usual after the banding procedure.  This is often from the untreated hemorrhoids  rather than the treated one.  Dont be concerned if there is a tablespoon or so of blood.  If there is more blood than this, lie flat with your bottom higher than your head and apply an ice pack to the area. If the bleeding does not stop within a half an hour or if you feel faint, call our office at [phone] or go to the emergency room.  Problems are not common; however, if there is a substantial amount of bleeding, severe pain, chills, fever or difficulty passing urine (very rare) or other problems, you should call us at [phone] or report to the nearest emergency room.  Do not stay seated continuously for more than 2-3 hours for a day or two after the procedure.  Tighten your buttock muscles 10-15 times every two hours and take 10-15 deep breaths every 1-2 hours.  Do not spend more than a few minutes on the toilet if you cannot empty your bowel; instead re-visit the toilet at a later time.   The [place] [facility] providers would like to encourage you to use [facility] to communicate with providers for non-urgent requests or questions.  Due to long hold times on the telephone, sending your provider a message by [employer] may be a faster and more efficient way to get a response.  Please allow 48 business hours for a response.  Please remember that this is for non-urgent requests.   It was a pleasure to see  you today!  Thank you for trusting me with your gastrointestinal care!    Scott E.Candis Schatz, MD

## 2021-07-05 NOTE — Progress Notes (Signed)
PROCEDURE NOTE: The patient presents with symptomatic grade 3  hemorrhoids, requesting rubber band ligation of his/her hemorrhoidal disease.  All risks, benefits and alternative forms of therapy were described and informed consent was obtained.   The anorectum was pre-medicated with topical lidocaine (5%) and nitroglycerin (0.125%) The decision was made to band the right posterior internal hemorrhoid, and the [place] was used to perform band ligation without complication.  Digital anorectal examination was then performed to assure proper positioning of the band, and to adjust the banded tissue as required.  The patient was discharged home without pain or other issues.  Dietary and behavioral recommendations were given and along with follow-up instructions.     The following adjunctive treatments were recommended:  Fiber supplementation Adequate water intake Avoidance of straining and hard stools  The patient will return in 2-4 weeks for  follow-up and possible additional banding as required. No complications were encountered and the patient tolerated the procedure well.   We also discussed his chest pain today which is doing better with the twice daily Protonix.  I recommended he continue the twice daily Protonix for now and recommended against switching to another PPI or pursuing pH/impedance testing  He was also instructed to go to the lab today to have the CA 19-9 lab drawn as previously recommended by Dr. Rush Landmark

## 2021-07-06 LAB — CANCER ANTIGEN 19-9: CA 19-9: 30 U/mL (ref <34)

## 2021-07-08 ENCOUNTER — Other Ambulatory Visit: Payer: Self-pay

## 2021-07-08 DIAGNOSIS — R933 Abnormal findings on diagnostic imaging of other parts of digestive tract: Secondary | ICD-10-CM

## 2021-07-08 DIAGNOSIS — K8689 Other specified diseases of pancreas: Secondary | ICD-10-CM

## 2021-07-11 ENCOUNTER — Telehealth: Payer: Self-pay

## 2021-07-11 NOTE — Telephone Encounter (Signed)
Called and spoke with patients wife letting her know that patient has been scheduled for a repeat MRCP on 07/26/21 @ 9 am arriving at 8:30 am nothing to eat or drink 4 hours prior. Also put in notes to use video interpreter.

## 2021-07-21 ENCOUNTER — Ambulatory Visit (HOSPITAL_COMMUNITY): Payer: 59

## 2021-07-26 ENCOUNTER — Other Ambulatory Visit: Payer: Self-pay | Admitting: Gastroenterology

## 2021-07-26 ENCOUNTER — Ambulatory Visit (HOSPITAL_COMMUNITY)
Admission: RE | Admit: 2021-07-26 | Discharge: 2021-07-26 | Disposition: A | Discharge status: Home / Self Care | Payer: 59 | Source: Ambulatory Visit | Attending: Gastroenterology | Admitting: Gastroenterology

## 2021-07-26 DIAGNOSIS — R933 Abnormal findings on diagnostic imaging of other parts of digestive tract: Secondary | ICD-10-CM | POA: Diagnosis present

## 2021-07-26 DIAGNOSIS — K8689 Other specified diseases of pancreas: Secondary | ICD-10-CM | POA: Insufficient documentation

## 2021-07-26 IMAGING — MR MR 3D RECON AT [facility]
18 of 20 series · 42 of 48 positions shown · IV contrast (6ml GADAVIST)
Comparison: MRI abdomen [DATE]

CLINICAL DATA: Pancreatic ductal dilatation



[Series 4: T2 fat-sat · axial · 6.0mm · 1.14mm/px · 1 of 36 slices shown]
[im 1/36]
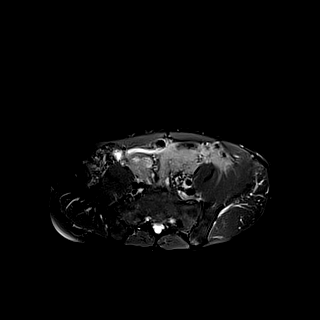

[Series 6: DWI · axial · 6.0mm · 1.36mm/px · z∈[-204,+48]mm · 3 of 72 slices shown (1 of 2)]
[im 1/72]
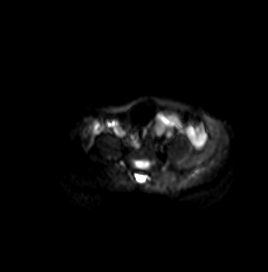
[im 36/72]
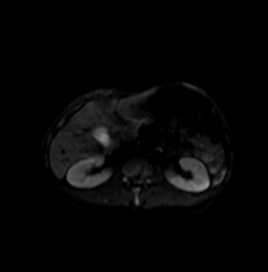
[im 72/72]
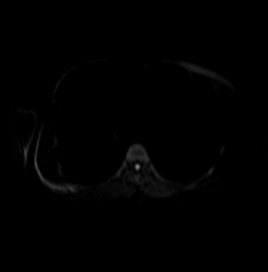

[Series 7: DWI · axial · 6.0mm · 1.36mm/px · 1 of 36 slices shown (2 of 2)]
[im 1/36]
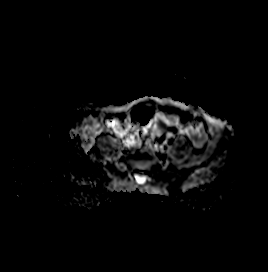

[Series 9: cor_3d_spc_trig · coronal · 1.0mm · 0.49mm/px · 3 of 72 slices shown]
[im 1/72]
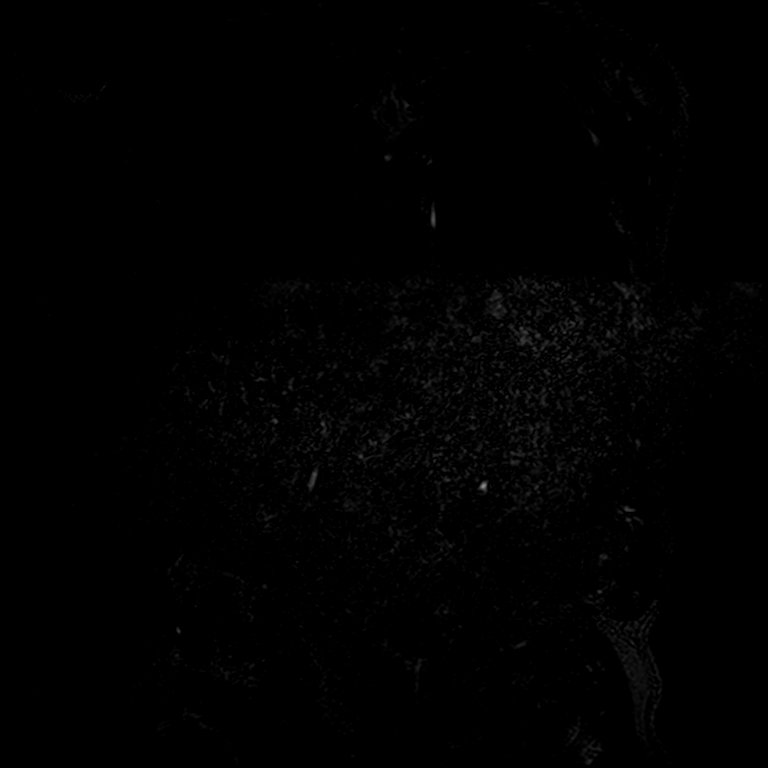
[im 36/72]
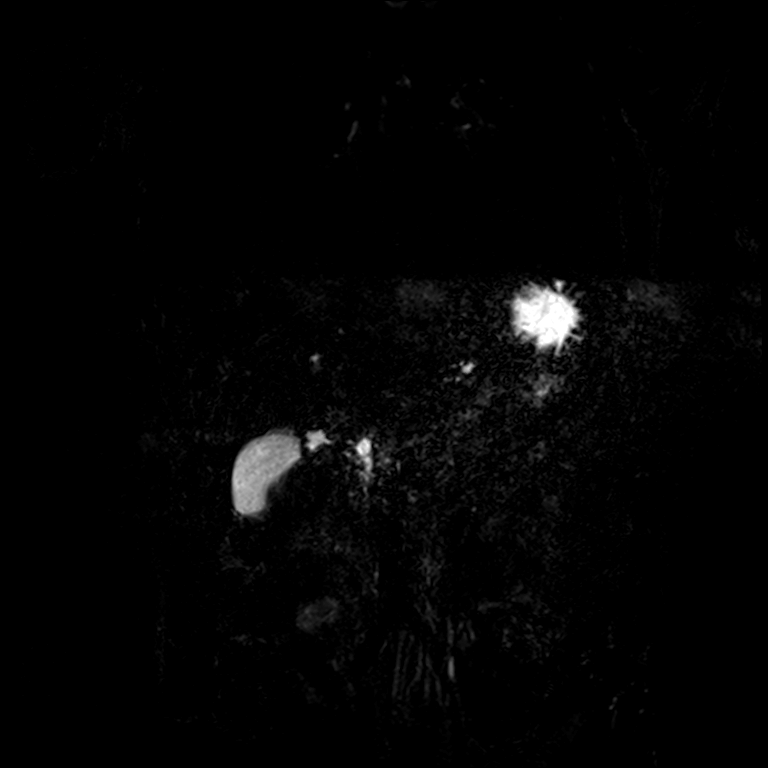
[im 72/72]
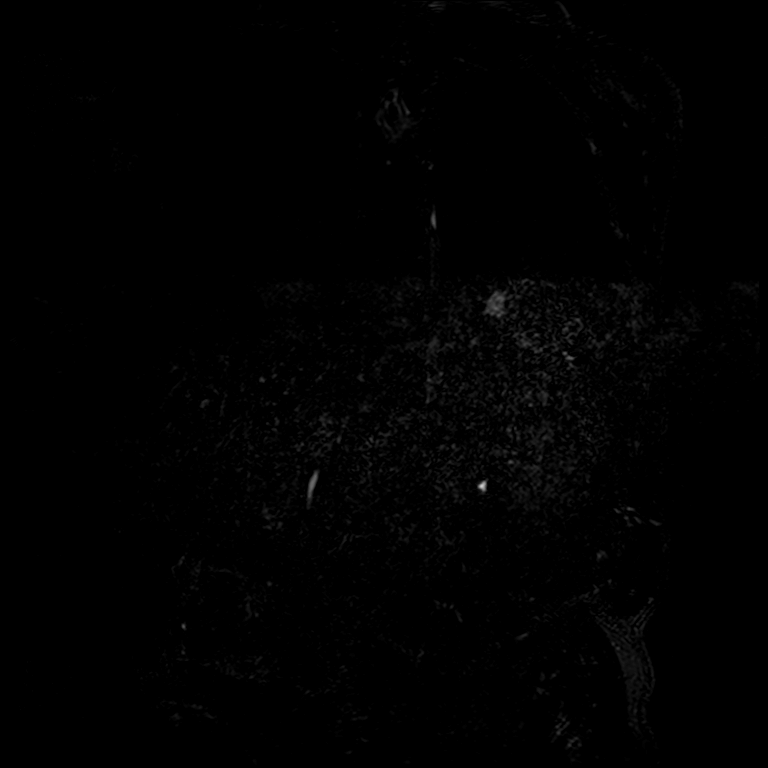

[Series 12: T2 · coronal · 6.0mm · 1.48mm/px · 1 of 27 slices shown (1 of 2)]
[im 1/27]
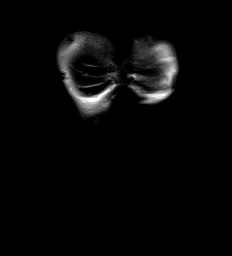

[Series 13: T1 · axial · 3.0mm · 1.12mm/px · z∈[-191,+22]mm · 3 of 72 slices shown (1 of 2)]
[im 1/72]
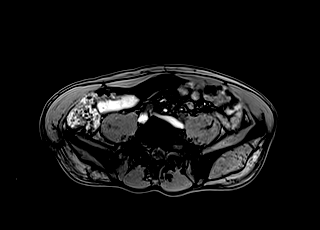
[im 36/72]
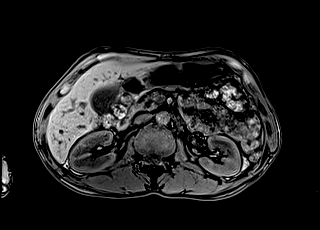
[im 72/72]
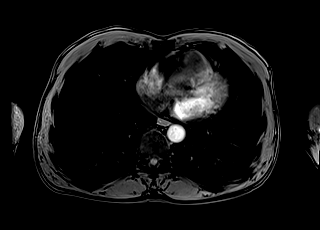

[Series 14: T1 · axial · 3.0mm · 1.12mm/px · z∈[-191,+22]mm · 3 of 72 slices shown (2 of 2)]
[im 1/72]
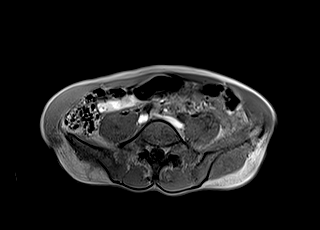
[im 36/72]
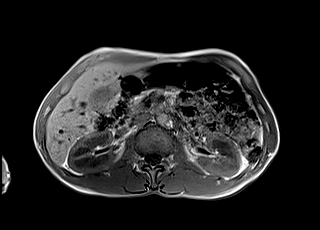
[im 72/72]
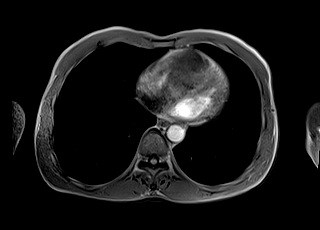

[Series 15: cor obl thk · sagittal · 50.0mm · 0.78mm/px · 1 of 9 slices shown]
[im 1/9]
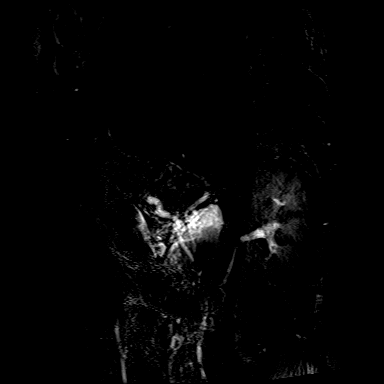

[Series 16: T2 · axial · 6.0mm · 1.41mm/px · 1 of 36 slices shown (2 of 2)]
[im 1/36]
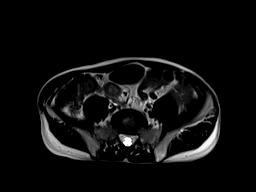

[Series 18: T1 dynamic · axial · 3.0mm · 1.12mm/px · z∈[-222,+39]mm · 3 of 88 slices shown (1 of 9)]
[im 1/88]
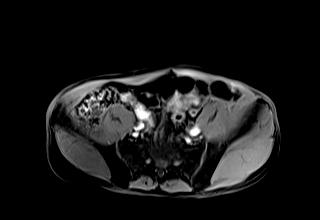
[im 44/88]
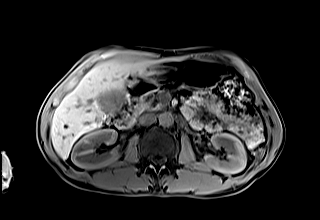
[im 88/88]
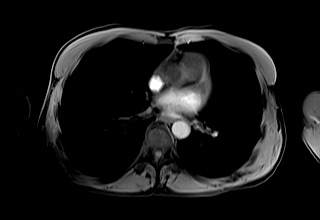

[Series 22: T1 dynamic · axial · 3.0mm · 1.12mm/px · z∈[-222,+39]mm · 3 of 88 slices shown (2 of 9)]
[im 1/88]
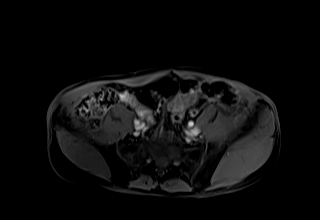
[im 44/88]
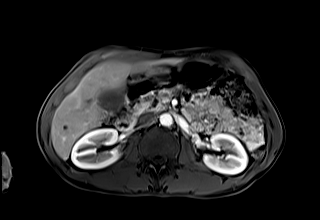
[im 88/88]
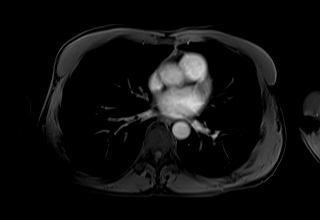

[Series 23: T1 dynamic · axial · 3.0mm · 1.12mm/px · z∈[-222,+39]mm · 3 of 88 slices shown (3 of 9)]
[im 1/88]
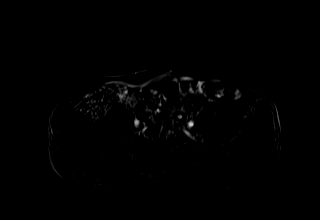
[im 44/88]
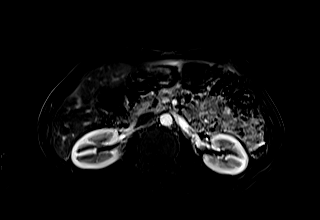
[im 88/88]
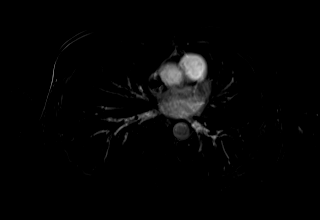

[Series 26: T1 dynamic · axial · 3.0mm · 1.12mm/px · z∈[-222,+39]mm · 3 of 88 slices shown (4 of 9)]
[im 1/88]
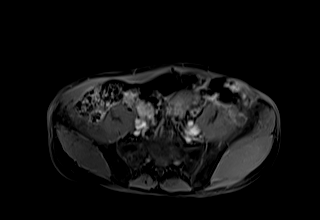
[im 44/88]
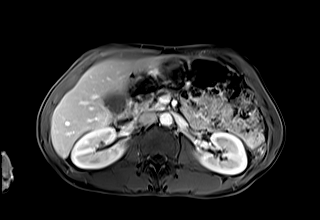
[im 88/88]
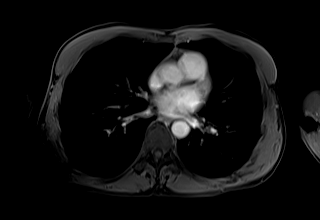

[Series 27: T1 dynamic · axial · 3.0mm · 1.12mm/px · z∈[-222,+39]mm · 3 of 88 slices shown (5 of 9)]
[im 1/88]
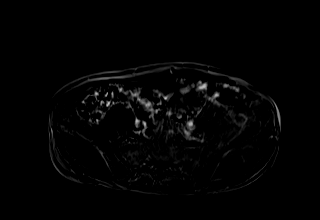
[im 44/88]
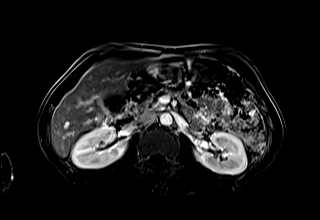
[im 88/88]
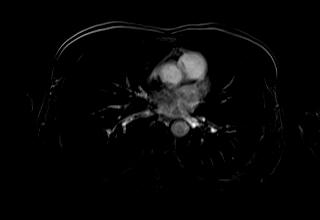

[Series 30: T1 dynamic · axial · 3.0mm · 1.12mm/px · z∈[-222,+39]mm · 3 of 88 slices shown (6 of 9)]
[im 1/88]
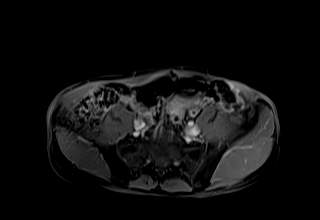
[im 44/88]
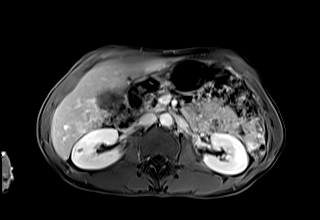
[im 88/88]
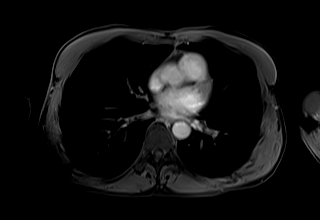

[Series 31: T1 dynamic · axial · 3.0mm · 1.12mm/px · z∈[-222,+39]mm · 3 of 88 slices shown (7 of 9)]
[im 1/88]
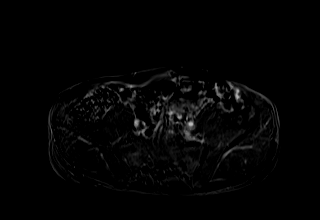
[im 44/88]
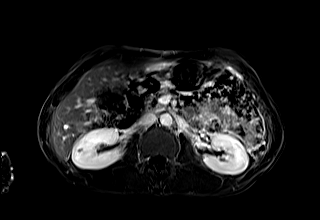
[im 88/88]
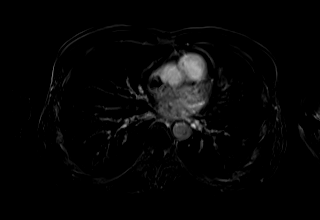

[Series 33: T1 dynamic · coronal · 3.0mm · 1.41mm/px · 2 of 64 slices shown (8 of 9)]
[im 1/64]
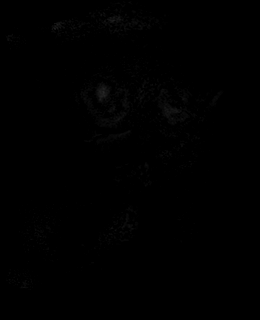
[im 64/64]
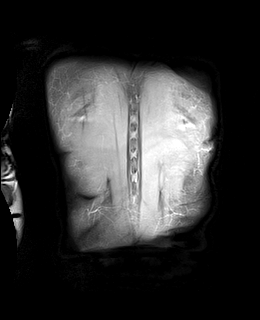

[Series 36: T1 dynamic · axial · 3.0mm · 1.12mm/px · z∈[-222,-93]mm · 2 of 88 slices shown (9 of 9)]
[im 1/88]
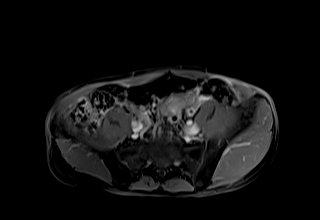
[im 44/88]
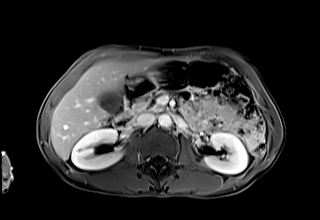

[42 of 48 positions shown; findings below may reference images not displayed]

FINDINGS: Study is limited due to motion.

Lower chest: No acute findings.

Hepatobiliary: Liver is normal in size and contour with no
suspicious mass identified. A few scattered tiny hepatic cysts
noted. Gallbladder appears normal. No biliary ductal dilatation.

Pancreas: Mildly atrophic with similar appearing main pancreatic
ductal dilatation measuring up to 6 mm in diameter in the body. Area
of ductal tortuosity again seen at the region of the neck of the
pancreas where there is similar-appearing questionable intraductal
enhancement versus volume averaging/possibly crossing vessel,
limited evaluation due to motion. Stable appearing adjacent
ductal/side-branch ectasia which measures up to 10 mm in size on the
coronal plane.

Spleen:  Within normal limits in size and appearance.

Adrenals/Urinary Tract: Adrenal glands appear normal. A few small
renal cortical cysts again seen. No hydronephrosis. No enhancing
renal mass identified bilaterally.

Stomach/Bowel: No evidence of bowel obstruction.

Vascular/Lymphatic: No pathologically enlarged lymph nodes
identified. No abdominal aortic aneurysm demonstrated.

Other:  No ascites.

Musculoskeletal: No suspicious bone lesions identified.
IMPRESSION: No significant change in appearance of the pancreas and pancreatic
duct since previous study as described. Pancreatic duct again
measures up to 6 mm in diameter with questionable subtle internal
enhancement within the duct near the transition at the neck.
Recommend continued follow-up with either endoscopic ultrasound if
not already performed, and/or repeat MRI in 6 months.

## 2021-07-26 MED ORDER — GADOBUTROL 1 MMOL/ML IV SOLN
5.0000 mL | Freq: Once | INTRAVENOUS | Status: AC | PRN
  Administered 2021-07-26: 5 mL via INTRAVENOUS

## 2021-08-04 ENCOUNTER — Ambulatory Visit (INDEPENDENT_AMBULATORY_CARE_PROVIDER_SITE_OTHER): Payer: 59 | Admitting: Gastroenterology

## 2021-08-04 ENCOUNTER — Encounter: Payer: Self-pay | Admitting: Gastroenterology

## 2021-08-04 VITALS — BP 122/60 | HR 80 | Ht 62.0 in | Wt 108.6 lb

## 2021-08-04 DIAGNOSIS — R933 Abnormal findings on diagnostic imaging of other parts of digestive tract: Secondary | ICD-10-CM | POA: Diagnosis not present

## 2021-08-04 DIAGNOSIS — K219 Gastro-esophageal reflux disease without esophagitis: Secondary | ICD-10-CM | POA: Diagnosis not present

## 2021-08-04 DIAGNOSIS — K642 Third degree hemorrhoids: Secondary | ICD-10-CM

## 2021-08-04 NOTE — Patient Instructions (Addendum)
If you are age 59 or older, your body mass index should be between 23-30. Your Body mass index is 19.86 kg/m?Marland Kitchen If this is out of the aforementioned range listed, please consider follow up with your Primary Care Provider. ? ?If you are age 67 or younger, your body mass index should be between 19-25. Your Body mass index is 19.86 kg/m?Marland Kitchen If this is out of the aformentioned range listed, please consider follow up with your Primary Care Provider.  ? ?HEMORRHOID BANDING PROCEDURE  ? ? FOLLOW-UP CARE ? ? ?The procedure you have had should have been relatively painless since the banding of the area involved does not have nerve endings and there is no pain sensation.  The rubber band cuts off the blood supply to the hemorrhoid and the band may fall off as soon as 48 hours after the banding (the band may occasionally be seen in the toilet bowl following a bowel movement). You may notice a temporary feeling of fullness in the rectum which should respond adequately to plain Tylenol? or Motrin?. ? ?Following the banding, avoid strenuous exercise that evening and resume full activity the next day.  A sitz bath (soaking in a warm tub) or bidet is soothing, and can be useful for cleansing the area after bowel movements.   ? ? ?To avoid constipation, take two tablespoons of natural wheat bran, natural oat bran, flax, Benefiber? or any over the counter fiber supplement and increase your water intake to 7-8 glasses daily.   ? ?Unless you have been prescribed anorectal medication, do not put anything inside your rectum for two weeks: No suppositories, enemas, fingers, etc. ? ?Occasionally, you may have more bleeding than usual after the banding procedure.  This is often from the untreated hemorrhoids rather than the treated one.  Don?t be concerned if there is a tablespoon or so of blood.  If there is more blood than this, lie flat with your bottom higher than your head and apply an ice pack to the area. If the bleeding does not stop  within a half an hour or if you feel faint, call our office at [phone] or go to the emergency room. ? ?Problems are not common; however, if there is a substantial amount of bleeding, severe pain, chills, fever or difficulty passing urine (very rare) or other problems, you should call us at [phone] or report to the nearest emergency room. ? ?Do not stay seated continuously for more than 2-3 hours for a day or two after the procedure.  Tighten your buttock muscles 10-15 times every two hours and take 10-15 deep breaths every 1-2 hours.  Do not spend more than a few minutes on the toilet if you cannot empty your bowel; instead re-visit the toilet at a later time. ? ? ? ? ?Please start Gaviscon at bedtime. ? ? ? ?The [place] [facility] providers would like to encourage you to use [facility] to communicate with providers for non-urgent requests or questions.  Due to long hold times on the telephone, sending your provider a message by [facility] may be a faster and more efficient way to get a response.  Please allow 48 business hours for a response.  Please remember that this is for non-urgent requests.  ? ?It was a pleasure to see you today! ? ?Thank you for trusting me with your gastrointestinal care!   ? ?Scott E.Candis Schatz, MD  ? ? ? ?

## 2021-08-04 NOTE — Progress Notes (Signed)
PROCEDURE NOTE: ?The patient presents with symptomatic grade 3  hemorrhoids, requesting rubber band ligation of his/her hemorrhoidal disease.  All risks, benefits and alternative forms of therapy were described and informed consent was obtained. ? ?This is his fifth banding.  The LL and RP columns were banded once and the RA column has been banded twice.  The patient feels that progress is being made with his hemorrhoid symptoms in terms of reduced bleeding and prolapse symptoms.  We discussed continuing banding vs referring to colorectal surgery vs no further intervention.  The patient preferred to proceed with further banding.  He has been taking the metamucil and he thinks this is helping. ? ?The anorectum was pre-medicated with topical lidocaine (5%) and nitroglycerin (0.125%) ?The decision was made to band the Left later internal hemorrhoid, and the [place] O?Regan System was used to perform band ligation without complication.  ?Digital anorectal examination was then performed to assure proper positioning of the band, and to adjust the banded tissue as required.  Again the amount of tissue was felt to be inadequate, and so we decided to band the Right Posterior column as well.  Digital examination of the banded RP column showed a more appropriate amount of hemorrhoid tissue. ? The patient was discharged home without pain or other issues.  Dietary and behavioral recommendations were given and along with follow-up instructions.   ?  ?The following adjunctive treatments were recommended: ? ?Fiber supplementation ?Adequate water intake ?Avoidance of straining and hard stools ? ?The patient will return in 2-4 weeks for  follow-up and possible additional banding as required. ?No complications were encountered and the patient tolerated the procedure well. ? ?We also discussed his GERD symptoms which are improved, but incompletely controlled with BID Protonix.  I advised him to make sure he is taking the Protonix 30-45  minutes before meals and suggested he start taking Gaviscon at night.  We also again discussed pH/impedance testing, but elected to hold off on this for now. ? ?I reviewed his MRCP results with him and the recommendation to repeat MRI and CA 19-9 in 6 months. ? ? ? ?

## 2021-08-04 NOTE — Progress Notes (Signed)
I reviewed the MRCP results with the patient during his hemorrhoid banding appointment.  He is aware the MRI showed no changes compared to his previous, and that we need to continue to monitor the pancreas with a repeat MRCP in 6 months and repeat CA 19-9 as well at that time.

## 2021-09-01 ENCOUNTER — Ambulatory Visit (INDEPENDENT_AMBULATORY_CARE_PROVIDER_SITE_OTHER): Payer: 59 | Admitting: Gastroenterology

## 2021-09-01 ENCOUNTER — Encounter: Payer: Self-pay | Admitting: Gastroenterology

## 2021-09-01 VITALS — BP 90/58 | HR 76 | Ht 62.0 in | Wt 106.8 lb

## 2021-09-01 DIAGNOSIS — K642 Third degree hemorrhoids: Secondary | ICD-10-CM

## 2021-09-01 NOTE — Patient Instructions (Signed)
If you are age 59 or older, your body mass index should be between 23-30. Your Body mass index is 19.53 kg/m?Marland Kitchen If this is out of the aforementioned range listed, please consider follow up with your Primary Care Provider. ? ?If you are age 29 or younger, your body mass index should be between 19-25. Your Body mass index is 19.53 kg/m?Marland Kitchen If this is out of the aformentioned range listed, please consider follow up with your Primary Care Provider.  ? ?Please start Gaviscon daily at bedtime. ? ?  ? ?HEMORRHOID BANDING PROCEDURE  ? ? FOLLOW-UP CARE ? ? ?The procedure you have had should have been relatively painless since the banding of the area involved does not have nerve endings and there is no pain sensation.  The rubber band cuts off the blood supply to the hemorrhoid and the band may fall off as soon as 48 hours after the banding (the band may occasionally be seen in the toilet bowl following a bowel movement). You may notice a temporary feeling of fullness in the rectum which should respond adequately to plain Tylenol? or Motrin?. ? ?Following the banding, avoid strenuous exercise that evening and resume full activity the next day.  A sitz bath (soaking in a warm tub) or bidet is soothing, and can be useful for cleansing the area after bowel movements.   ? ? ?To avoid constipation, take two tablespoons of natural wheat bran, natural oat bran, flax, Benefiber? or any over the counter fiber supplement and increase your water intake to 7-8 glasses daily.   ? ?Unless you have been prescribed anorectal medication, do not put anything inside your rectum for two weeks: No suppositories, enemas, fingers, etc. ? ?Occasionally, you may have more bleeding than usual after the banding procedure.  This is often from the untreated hemorrhoids rather than the treated one.  Don?t be concerned if there is a tablespoon or so of blood.  If there is more blood than this, lie flat with your bottom higher than your head and apply an ice  pack to the area. If the bleeding does not stop within a half an hour or if you feel faint, call our office at [phone] or go to the emergency room. ? ?Problems are not common; however, if there is a substantial amount of bleeding, severe pain, chills, fever or difficulty passing urine (very rare) or other problems, you should call us at [phone] or report to the nearest emergency room. ? ?Do not stay seated continuously for more than 2-3 hours for a day or two after the procedure.  Tighten your buttock muscles 10-15 times every two hours and take 10-15 deep breaths every 1-2 hours.  Do not spend more than a few minutes on the toilet if you cannot empty your bowel; instead re-visit the toilet at a later time. ? ?  ?It was a pleasure to see you today! ? ?Thank you for trusting me with your gastrointestinal care!   ? ?Scott E.Candis Schatz, MD  ? ?The [place] [facility] providers would like to encourage you to use [facility] to communicate with providers for non-urgent requests or questions.  Due to long hold times on the telephone, sending your provider a message by [facility] may be a faster and more efficient way to get a response.  Please allow 48 business hours for a response.  Please remember that this is for non-urgent requests.  ? ? ? ?

## 2021-09-01 NOTE — Progress Notes (Signed)
PROCEDURE NOTE: ?The patient presents with symptomatic grade 3  hemorrhoids, requesting rubber band ligation of his/her hemorrhoidal disease.  All risks, benefits and alternative forms of therapy were described and informed consent was obtained. ? ?The patient reports continued improvement in his hematochezia and prolapse symptoms since his last banding. ? ?The anorectum was pre-medicated with topical lidocaine (5%) and nitroglycerin (0.125%) ?The decision was made to band the left lateral internal hemorrhoid, and the [place] O?Regan System was used to perform band ligation without complication.  Again, banding of the LL hemorrhoid was unsuccessful due to inadequate tissue.  The decision was was made to perform anoscopy in order to better identify the problem hemorrhoids.  Anoscopic exam revealed a prominent right posterior column.  Decision was made to repeat banding of this hemorrhoid column (this is the same column that was banded last month). ?The RP column was successfully banded. ?Digital anorectal examination was then performed to assure proper positioning of the band, and to adjust the banded tissue as required. ? The patient was discharged home without pain or other issues.  Dietary and behavioral recommendations were given and along with follow-up instructions.   ?  ?The following adjunctive treatments were recommended: ? ?Fiber supplementation ?Adequate water intake ?Avoidance of straining and hard stools ? ?The patient will return in 2-4 weeks for  follow-up and possible additional banding as required. ?No complications were encountered and the patient tolerated the procedure well. ? ?

## 2021-11-18 ENCOUNTER — Other Ambulatory Visit: Payer: Self-pay | Admitting: Family Medicine

## 2021-11-24 ENCOUNTER — Encounter: Payer: Self-pay | Admitting: Gastroenterology

## 2021-11-24 ENCOUNTER — Ambulatory Visit (INDEPENDENT_AMBULATORY_CARE_PROVIDER_SITE_OTHER): Payer: 59 | Admitting: Gastroenterology

## 2021-11-24 VITALS — BP 90/50 | HR 77 | Ht 64.0 in | Wt 105.8 lb

## 2021-11-24 DIAGNOSIS — K642 Third degree hemorrhoids: Secondary | ICD-10-CM

## 2021-11-24 NOTE — Patient Instructions (Addendum)
If you are age 59 or older, your body mass index should be between 23-30. Your Body mass index is 18.16 kg/m. If this is out of the aforementioned range listed, please consider follow up with your Primary Care Provider.  If you are age 93 or younger, your body mass index should be between 19-25. Your Body mass index is 18.16 kg/m. If this is out of the aformentioned range listed, please consider follow up with your Primary Care Provider.   ________________________________________________________  The [place] [facility] providers would like to encourage you to use [facility] to communicate with providers for non-urgent requests or questions.  Due to long hold times on the telephone, sending your provider a message by [facility] may be a faster and more efficient way to get a response.  Please allow 48 business hours for a response.  Please remember that this is for non-urgent requests.  _______________________________________________________   Thayer Jew PROCEDURE    FOLLOW-UP CARE   The procedure you have had should have been relatively painless since the banding of the area involved does not have nerve endings and there is no pain sensation.  The rubber band cuts off the blood supply to the hemorrhoid and the band may fall off as soon as 48 hours after the banding (the band may occasionally be seen in the toilet bowl following a bowel movement). You may notice a temporary feeling of fullness in the rectum which should respond adequately to plain Tylenol or Motrin.  Following the banding, avoid strenuous exercise that evening and resume full activity the next day.  A sitz bath (soaking in a warm tub) or bidet is soothing, and can be useful for cleansing the area after bowel movements.     To avoid constipation, take two tablespoons of natural wheat bran, natural oat bran, flax, Benefiber or any over the counter fiber supplement and increase your water intake to 7-8 glasses daily.    Unless you  have been prescribed anorectal medication, do not put anything inside your rectum for two weeks: No suppositories, enemas, fingers, etc.  Occasionally, you may have more bleeding than usual after the banding procedure.  This is often from the untreated hemorrhoids rather than the treated one.  Don't be concerned if there is a tablespoon or so of blood.  If there is more blood than this, lie flat with your bottom higher than your head and apply an ice pack to the area. If the bleeding does not stop within a half an hour or if you feel faint, call our office at [phone] or go to the emergency room.  Problems are not common; however, if there is a substantial amount of bleeding, severe pain, chills, fever or difficulty passing urine (very rare) or other problems, you should call us at [phone] or report to the nearest emergency room.  Do not stay seated continuously for more than 2-3 hours for a day or two after the procedure.  Tighten your buttock muscles 10-15 times every two hours and take 10-15 deep breaths every 1-2 hours.  Do not spend more than a few minutes on the toilet if you cannot empty your bowel; instead re-visit the toilet at a later time.   Thank you for entrusting me with your care and choosing [facility].  Dr. Candis Schatz

## 2021-11-24 NOTE — Progress Notes (Signed)
PROCEDURE NOTE: The patient presents with symptomatic grade 3  hemorrhoids, requesting rubber band ligation of his/her hemorrhoidal disease.  All risks, benefits and alternative forms of therapy were described and informed consent was obtained.  March 25, 2021: Left lateral column banded April 19, 2021: Failed right posterior band, right anterior column banded May 17, 2021: Right anterior column again banded July 05, 2021: Right posterior column banded August 04, 2021: Left lateral column banded, but inadequate tissue, right posterior column again banded September 01, 2021: Anoscopy exam revealed prominent right posterior column; right posterior column again banded   This is the patient's seventh banding session.  His last banding took place April 20th, and the RP column was banded after an anoscopic exam showed this to be the most prominent hemorrhoid column.  The patient reports that his hemorrhoid symptoms are significantly improved from the banding, but he does continue to have some prolapse and associated mucus, and desires further banding.  Anoscopy was performed again and again demonstrated a prominent hemorrhoid arising from the right posterior column  The anorectum was pre-medicated with topical lidocaine (5%) and nitroglycerin (0.125%) The decision was made to band the right posterior internal hemorrhoid, and the [place] was used to perform band ligation without complication.  Digital anorectal examination was then performed to assure proper positioning of the band, and to adjust the banded tissue as required.  The patient was discharged home without pain or other issues.  Dietary and behavioral recommendations were given and along with follow-up instructions.     The following adjunctive treatments were recommended:  Fiber supplementation Adequate water intake Avoidance of straining and hard stools  The patient will return in 2-4 weeks for  follow-up and  possible additional banding as required. No complications were encountered and the patient tolerated the procedure well.

## 2021-12-05 ENCOUNTER — Other Ambulatory Visit: Payer: Self-pay | Admitting: Gastroenterology

## 2021-12-23 ENCOUNTER — Encounter: Payer: Self-pay | Admitting: Gastroenterology

## 2021-12-27 ENCOUNTER — Ambulatory Visit: Payer: 59 | Admitting: Gastroenterology

## 2022-02-02 ENCOUNTER — Ambulatory Visit: Payer: Commercial Managed Care - HMO | Admitting: Gastroenterology

## 2022-02-02 ENCOUNTER — Encounter: Payer: Self-pay | Admitting: Gastroenterology

## 2022-02-02 ENCOUNTER — Other Ambulatory Visit: Payer: Medicaid Other

## 2022-02-02 VITALS — BP 100/68 | HR 80 | Ht 64.0 in | Wt 107.0 lb

## 2022-02-02 DIAGNOSIS — R933 Abnormal findings on diagnostic imaging of other parts of digestive tract: Secondary | ICD-10-CM

## 2022-02-02 DIAGNOSIS — K861 Other chronic pancreatitis: Secondary | ICD-10-CM | POA: Diagnosis not present

## 2022-02-02 DIAGNOSIS — K642 Third degree hemorrhoids: Secondary | ICD-10-CM

## 2022-02-02 DIAGNOSIS — K219 Gastro-esophageal reflux disease without esophagitis: Secondary | ICD-10-CM

## 2022-02-02 DIAGNOSIS — Z86010 Personal history of colonic polyps: Secondary | ICD-10-CM | POA: Diagnosis not present

## 2022-02-02 MED ORDER — PANCRELIPASE (LIP-PROT-AMYL) 36000-114000 UNITS PO CPEP: 36000.0000 [IU] | ORAL_CAPSULE | Freq: Three times a day (TID) | ORAL | 3 refills | Status: DC

## 2022-02-02 NOTE — Progress Notes (Signed)
HPI : Jack Moss is a very pleasant 61 year old male who I initially saw in November 2022 with symptoms of atypical chest pain/abdominal pain and symptomatic hemorrhoids who presents today for further evaluation management of ongoing abdominal pain. The patient underwent an EGD in November 2022 and was found to have granular appearing gastric mucosa, with biopsies positive for H. pylori.  He was treated with quadruple therapy, and his subsequent H. pylori stool antigen was negative.  He did report improvement in his symptoms with this. He has undergone numerous hemorrhoid banding sessions for his prolapsing hemorrhoids, most recently in July.  He reports ongoing improvement in his symptoms, but not complete resolution, and he does desire to undergo further banding. Had undergone a CT scan in July in the emergency department because of his chest pain.  The CT scan showed a dilated pancreatic duct.  Subsequent MRCP in December 2022 showed pancreatic atrophy and ductal dilation up to 6 mm with sidebranch ectasia.  An area of transition was seen in the neck of the pancreas.  He underwent an endoscopic ultrasound in January which showed features of chronic pancreatitis, dilated pancreatic duct but no discrete mass or lesion.  He was recommended to repeat MRI in 3 months.  The repeat MRI in March 2023 showed a stable ductal dilation no additional concerning findings.  Repeat MRI was recommended in 6 months.  Today, the patient reports ongoing issues with abdominal pain, although not as severe as they were a year ago.  Abdominal pain is localized to the upper abdomen.  He states that it is more noticeable with activity and movement, typically bending over.  He has a good appetite and denies problems with nausea or vomiting.  The pain does not awaken him from sleep.  He has difficulty describing the character of the pain.  He can continues to take Protonix daily, and he feels that this is helpful. His weight is  stable.  He does continue to smoke, but he does not drink alcohol.  He reports regular bowel movements and denies any problems with hard stools or straining.  He does continue to have issues with prolapse and perirectal discomfort from his hemorrhoids.   Past Medical History:  Diagnosis Date   GERD (gastroesophageal reflux disease)    Hemorrhoids    Hyperlipidemia    EUS:  Jan 2023 Dr. Rush Landmark  EUS Impression: - Pancreatic parenchymal abnormalities consisting of lobularity and hyperechoic strands were noted in the entire pancreas. - The pancreatic duct had a tortuous/ectatic appearance in the genu of the pancreas. - The pancreatic duct in the pancreatic head, genu of the pancreas, body of the pancreas and tail of the pancreas. - Changes within pancreas suggestive of chronic pancreatitis but not definite based on [place] criteria. - No evidence of a mass/lesion noted in pancreas. - There was no sign of significant pathology in the common bile duct and in the common hepatic duct. - Hyperechoic material consistent with sludge was visualized endosonographically in the gallbladder. - A polyp was found in the gallbladder. - No malignant-appearing lymph nodes were visualized in the celiac region (level 20), peripancreatic region and porta hepatis region.    Colonoscopy Sept 2022:  Technically difficult due to looping/tortuosity:  4 polyps, including 12 mm pedunculated sigmoid polyp with focal high grade dysplasia, recommended repeat in 3 years  EGD Sept 2022: Granular gastric mucosa, scarring/deformity in duodenal bulb Gastric biopsies positive for H. Pylori   MRCP Mar 2023 IMPRESSION: No significant change in  appearance of the pancreas and pancreatic duct since previous study as described. Pancreatic duct again measures up to 6 mm in diameter with questionable subtle internal enhancement within the duct near the transition at the neck. Recommend continued follow-up with  either endoscopic ultrasound if not already performed, and/or repeat MRI in 6 months.   MRCP Dec 2022 IMPRESSION: Pancreatic atrophy and signs of ductal dilation mainly in the dorsal pancreatic duct. Question of internal enhancement and subtle low signal about the site of most pronounced ductal transition. Findings are suspicious for small occult lesion or main duct intraductal papillary mucinous neoplasm complicated by early malignant transformation. Would suggest endoscopic ultrasound for further evaluation. No upper abdominal lymphadenopathy.  No suspicious hepatic lesion.   CT abd/pelvis July 2022 IMPRESSION: 1. No acute findings in the abdomen or pelvis. 2. Signs of isolated pancreatic ductal dilation without visible lesion. No substantial atrophy of peripheral pancreas. Findings may be related to prior pancreatitis, given degree of pancreatic ductal distension consider follow-up MRI MRCP, preferably outside of the acute setting when the patient is better able to hold their breath and remain still during the evaluation. This will exclude the possibility of developing intraductal papillary mucinous neoplasm of the main duct or occult lesion with obstruction. 3. Unusual course of the sigmoid colon passing behind the small bowel mesentery in the central abdomen. No frank twist or stranding. No signs of obstruction. This may be due to variant anatomy. If pain is colicky or recurrent could consider a follow-up evaluation with positive oral contrast. There is no stranding, obstruction or thickening to suggest acute process at this time. 4. Aortic atherosclerosis.  Past Surgical History:  Procedure Laterality Date   ESOPHAGOGASTRODUODENOSCOPY (EGD) WITH PROPOFOL N/A 06/02/2021   Procedure: ESOPHAGOGASTRODUODENOSCOPY (EGD) WITH PROPOFOL;  Surgeon: Rush Landmark Telford Nab., MD;  Location: [facility];  Service: Endoscopy;  Laterality: N/A;   UPPER ESOPHAGEAL ENDOSCOPIC ULTRASOUND  (EUS) N/A 06/02/2021   Procedure: UPPER ESOPHAGEAL ENDOSCOPIC ULTRASOUND (EUS);  Surgeon: Rush Landmark Telford Nab., MD;  Location: [facility];  Service: Endoscopy;  Laterality: N/A;   Family History  Problem Relation Age of Onset   Colon cancer Neg Hx    Esophageal cancer Neg Hx    Rectal cancer Neg Hx    Stomach cancer Neg Hx    Social History   Tobacco Use   Smoking status: Every Day    Packs/day: 0.50    Types: Cigarettes   Smokeless tobacco: Never  Vaping Use   Vaping Use: Never used  Substance Use Topics   Alcohol use: Never   Drug use: Never   Current Outpatient Medications  Medication Sig Dispense Refill   aspirin EC 81 MG tablet Take 1 tablet (81 mg total) by mouth daily. Swallow whole. 30 tablet 11   Carboxymethylcell-Hypromellose (GENTEAL OP) Place 1 drop into both eyes 2 (two) times daily.     diphenhydrAMINE-zinc acetate (BENADRYL EXTRA STRENGTH) cream Apply 1 application topically 3 (three) times daily as needed for itching. 28.4 g 0   metoprolol tartrate (LOPRESSOR) 25 MG tablet Take 1 tablet (25 mg total) by mouth as directed. Take 1 tablet 2 hours before the CT scan 1 tablet 0   pantoprazole (PROTONIX) 40 MG tablet Take 1 tablet by mouth once daily 90 tablet 0   rosuvastatin (CRESTOR) 10 MG tablet Take 1 tablet by mouth once daily 90 tablet 0   No current facility-administered medications for this visit.   No Known Allergies   Review of Systems: All systems reviewed  and negative except where noted in HPI.    No results found.  Physical Exam: BP 100/68   Pulse 80   Ht '5\' 4"'$  (1.626 m)   Wt 107 lb (48.5 kg)   BMI 18.37 kg/m  Constitutional: Pleasant,well-developed, Asian male in no acute distress.  Accompanied by his spouse who serves as Optometrist HEENT: Normocephalic and atraumatic. Conjunctivae are normal. No scleral icterus. Neck supple.  Cardiovascular: Normal rate, regular rhythm.  Pulmonary/chest: Effort normal and breath sounds normal. No  wheezing, rales or rhonchi. Abdominal: Soft, nondistended, mild tenderness to palpation in the epigastrium, without rigidity or guarding bowel sounds active throughout.  Abdominal tenderness to palpation is improved with flexion of the abdominis rectus muscles (negative Carnett's sign) there are no masses palpable. No hepatomegaly. Extremities: no edema Neurological: Alert and oriented to person place and time. Skin: Skin is warm and dry. No rashes noted. Psychiatric: Normal mood and affect. Behavior is normal.  CBC    Component Value Date/Time   WBC 5.5 11/25/2020 1037   RBC 4.34 11/25/2020 1037   HGB 13.5 11/25/2020 1037   HCT 40.1 11/25/2020 1037   PLT 345 11/25/2020 1037   MCV 92.4 11/25/2020 1037   MCH 31.1 11/25/2020 1037   MCHC 33.7 11/25/2020 1037   RDW 13.2 11/25/2020 1037    CMP     Component Value Date/Time   NA 143 03/17/2021 1543   K 4.3 03/17/2021 1543   CL 106 03/17/2021 1543   CO2 23 03/17/2021 1543   GLUCOSE 87 03/17/2021 1543   GLUCOSE 105 (H) 11/25/2020 1037   BUN 9 03/17/2021 1543   CREATININE 0.78 03/17/2021 1543   CALCIUM 9.5 03/17/2021 1543   PROT 6.9 11/25/2020 1037   ALBUMIN 3.8 11/25/2020 1037   AST 26 11/25/2020 1037   ALT 31 11/25/2020 1037   ALKPHOS 41 11/25/2020 1037   BILITOT 0.2 (L) 11/25/2020 1037   GFRNONAA >60 11/25/2020 1037     ASSESSMENT AND PLAN: 61 year old male active smoker with incidentally noted dilated pancreatic duct on CT in July 2022, with stable abnormalities noted on MRCP in December and March.  He has ongoing issues with abdominal pain and chest pain which are partially improved with PPI.  Although his characterization of his symptoms are not typical for chronic pancreatitis, the patient does have endoscopic evidence of chronic pancreatitis, is a chronic smoker and has tenderness to palpation in epigastrium.  I think it be reasonable to empirically treat for chronic pancreatitis with PERT.  We will start Creon 36,000 units  with meals and snacks. Patient is due for surveillance of his pancreatic ductal abnormalities.  We will order repeat MRCP and CA 19-9. The patient request further banding of his hemorrhoids.  I offered referral to a colorectal surgeon to consider other therapies, given the persistence of symptoms despite 7 bandings, but the patient insists that his symptoms have continued to improve with banding, just not completely resolved.  We will therefore schedule patient for another banding session. Patient will be due for his surveillance colonoscopy in September 2025.   Abdominal pain, suspected chronic pancreatitis - Start Creon 36K (1 capsule) with meals/snacks - Patient counseled to stop smoking  Imaging abnormalities of the pancreas - Due for surveillance MRCP and repeat CA 19-9  Internal hemorrhoids, grade 3 - Schedule for repeat banding  History of pedunculated polyp with focal high-grade dysplasia, September 2022 - Surveillance colonoscopy Sept 2025   Estha Few E. Candis Schatz, MD [facility]   CC:  Jacelyn Grip, MD

## 2022-02-02 NOTE — Patient Instructions (Signed)
If you are age 59 or older, your body mass index should be between 23-30. Your Body mass index is 18.37 kg/m. If this is out of the aforementioned range listed, please consider follow up with your Primary Care Provider.  If you are age 26 or younger, your body mass index should be between 19-25. Your Body mass index is 18.37 kg/m. If this is out of the aformentioned range listed, please consider follow up with your Primary Care Provider.   Your provider has requested that you go to the basement level for lab work before leaving today. Press "B" on the elevator. The lab is located at the first door on the left as you exit the elevator.   We have sent the following medications to your pharmacy for you to pick up at your convenience:Creon 36,000 units: Take one tablet with meals and snack.  You have been scheduled for an MRI at [facility] on 02/09/22. Your appointment time is 2:30pm. Please arrive to admitting (at main entrance of the hospital) 15 minutes prior to your appointment time for registration purposes. Please make certain not to have anything to eat or drink 6 hours prior to your test. In addition, if you have any metal in your body, have a pacemaker or defibrillator, please be sure to let your ordering physician know. This test typically takes 45 minutes to 1 hour to complete. Should you need to reschedule, please call [phone] to do so.     The [facility] providers would like to encourage you to use [facility] to communicate with providers for non-urgent requests or questions.  Due to long hold times on the telephone, sending your provider a message by [facility] may be a faster and more efficient way to get a response.  Please allow 48 business hours for a response.  Please remember that this is for non-urgent requests.   It was a pleasure to see you today!  Thank you for trusting me with your gastrointestinal care!    Scott E.Candis Schatz, MD

## 2022-02-03 LAB — CANCER ANTIGEN 19-9: CA 19-9: 40 U/mL — ABNORMAL HIGH (ref <34)

## 2022-02-03 LAB — CA 19-9 (SERIAL): CA 19-9: 21 U/mL (ref 0–35)

## 2022-02-08 ENCOUNTER — Other Ambulatory Visit (HOSPITAL_COMMUNITY): Payer: Self-pay

## 2022-02-08 ENCOUNTER — Other Ambulatory Visit: Payer: Self-pay

## 2022-02-08 MED ORDER — PANCREAZE 37000-97300 UNITS PO CPEP: 3700.0000 [IU] | ORAL_CAPSULE | ORAL | 3 refills | Status: DC

## 2022-02-09 ENCOUNTER — Telehealth: Payer: Self-pay | Admitting: Gastroenterology

## 2022-02-09 ENCOUNTER — Ambulatory Visit (HOSPITAL_COMMUNITY): Payer: Commercial Managed Care - HMO

## 2022-02-09 NOTE — Telephone Encounter (Signed)
Inbound call from patient spouse stating she received a call from patient pharmacy stating his insurance will not cover medication Pancreaze and is inquiring if patient can be prescribed something else. Please advise.   Thank you

## 2022-02-21 ENCOUNTER — Other Ambulatory Visit (HOSPITAL_COMMUNITY): Payer: Self-pay

## 2022-02-21 ENCOUNTER — Telehealth: Payer: Self-pay

## 2022-02-21 NOTE — Telephone Encounter (Signed)
Received patient assistance forms for Creon for patient. Patient is currently prescribed for Pancreaze. In order to fill out forms, a Creon prescription must be written. Below are options on form.

## 2022-02-22 ENCOUNTER — Other Ambulatory Visit (HOSPITAL_COMMUNITY): Payer: Self-pay

## 2022-02-22 ENCOUNTER — Other Ambulatory Visit: Payer: Self-pay

## 2022-02-22 MED ORDER — PANCRELIPASE (LIP-PROT-AMYL) 36000-114000 UNITS PO CPEP: 36000.0000 [IU] | ORAL_CAPSULE | Freq: Three times a day (TID) | ORAL | 3 refills | Status: AC

## 2022-02-22 NOTE — Telephone Encounter (Signed)
Patient has been provided a copy of the assistance forms. His wife is supposed to be bringing those back along with the financial information. I will have Dr.Cunningham sign the paper work as well.

## 2022-02-22 NOTE — Telephone Encounter (Signed)
Creon prescription has been sent in.

## 2022-03-08 ENCOUNTER — Other Ambulatory Visit: Payer: Self-pay | Admitting: Family Medicine

## 2022-03-08 ENCOUNTER — Other Ambulatory Visit: Payer: Self-pay | Admitting: Gastroenterology

## 2022-03-16 ENCOUNTER — Inpatient Hospital Stay: Admission: RE | Admit: 2022-03-16 | Payer: Commercial Managed Care - HMO | Source: Ambulatory Visit

## 2022-03-16 ENCOUNTER — Other Ambulatory Visit: Payer: Self-pay

## 2022-03-30 ENCOUNTER — Other Ambulatory Visit: Payer: Commercial Managed Care - HMO

## 2022-04-12 ENCOUNTER — Telehealth: Payer: Self-pay | Admitting: Gastroenterology

## 2022-04-12 NOTE — Telephone Encounter (Signed)
Inbound call from [place] with [facility], stating they are not able to scan patient in tomorrow due to him needing an interpretor and they do not have one. Informed me that they have reached out to patient and left him a detailed message.  Please advise.

## 2022-04-12 NOTE — Telephone Encounter (Signed)
Jack Moss not sure if you ordered this scan or not.

## 2022-04-13 ENCOUNTER — Other Ambulatory Visit: Payer: Commercial Managed Care - HMO

## 2022-04-17 NOTE — Telephone Encounter (Signed)
Left VM letting patient know that he has been rescheduled for an MRCP 05/04/22 @ 2:50pm. I also let patient know to call me if they were cancelled again due to no interpreter being available. I spoke with [facility] letting them know to use [place] interpreter and to please call before cancelling again to avoid any delay in patients care. Patient has been rescheduled multiple times due to not interpreter being avalible.

## 2022-04-17 NOTE — Telephone Encounter (Signed)
[facility] called and informed me that they cancelled patients appointment because they have chinese but do not have the patients specific dialect. I called [facility] and scheduled patient at [facility] radiology for 12/14 '@3pm'$ , [place] stated that they would be able to use video interpreter. Left VM informing patient of change and to return call.

## 2022-04-18 NOTE — Telephone Encounter (Signed)
Spoke with [facility] imaging and they stated they would have someone from management call me back as soon as possible.

## 2022-04-18 NOTE — Telephone Encounter (Signed)
Patient returned call stating that [facility] will not take the his insurance and is needing an alternative solution. I told patient I would talk to management and find a solution.

## 2022-04-25 NOTE — Telephone Encounter (Signed)
Left VM for patient's wife letting her know that patient has been rescheduled for [facility] imaging for 05/11/22 @ 9:30am.

## 2022-04-27 ENCOUNTER — Ambulatory Visit (HOSPITAL_COMMUNITY): Payer: Commercial Managed Care - HMO

## 2022-05-04 ENCOUNTER — Other Ambulatory Visit: Payer: Medicaid Other

## 2022-05-11 ENCOUNTER — Ambulatory Visit
Admission: RE | Admit: 2022-05-11 | Discharge: 2022-05-11 | Disposition: A | Discharge status: Home / Self Care | Payer: Commercial Managed Care - HMO | Source: Ambulatory Visit | Attending: Gastroenterology | Admitting: Gastroenterology

## 2022-05-11 DIAGNOSIS — K861 Other chronic pancreatitis: Secondary | ICD-10-CM

## 2022-05-11 MED ORDER — GADOPICLENOL 0.5 MMOL/ML IV SOLN
6.0000 mL | Freq: Once | INTRAVENOUS | Status: AC | PRN
  Administered 2022-05-11: 6 mL via INTRAVENOUS

## 2022-05-20 NOTE — Progress Notes (Signed)
Jack Moss,  Please inform Jack Moss (via interpretor) that his MRI did not show any significant changes from previous.  Repeat MRI is recommended in 1 year.  Will also repeat CA 19-9 at that time.

## 2022-06-02 ENCOUNTER — Other Ambulatory Visit: Payer: Self-pay | Admitting: Family Medicine

## 2022-06-02 ENCOUNTER — Other Ambulatory Visit: Payer: Self-pay | Admitting: Gastroenterology

## 2022-06-19 ENCOUNTER — Telehealth: Payer: Self-pay | Admitting: Pharmacy Technician

## 2022-06-19 ENCOUNTER — Other Ambulatory Visit (HOSPITAL_COMMUNITY): Payer: Self-pay

## 2022-06-19 NOTE — Telephone Encounter (Signed)
Patient Advocate Encounter  Received notification from [facility] that prior authorization for PANTOPRAZOLE '40MG'$  is required.   PA submitted on 2.5.24 [facility]  Status is pending

## 2022-06-20 ENCOUNTER — Other Ambulatory Visit (HOSPITAL_COMMUNITY): Payer: Self-pay

## 2022-06-23 NOTE — Telephone Encounter (Signed)
Patient Advocate Encounter  Prior Authorization for PANTOPRAZOLE [id number] has been approved.    PA# -- Effective dates: 2.5.24 through 2.3.25

## 2022-08-30 ENCOUNTER — Other Ambulatory Visit: Payer: Self-pay | Admitting: Family Medicine

## 2022-09-01 ENCOUNTER — Other Ambulatory Visit: Payer: Self-pay | Admitting: Family Medicine

## 2022-09-13 ENCOUNTER — Other Ambulatory Visit: Payer: Self-pay | Admitting: Gastroenterology

## 2022-11-28 ENCOUNTER — Other Ambulatory Visit: Payer: Self-pay | Admitting: Family Medicine

## 2022-12-11 ENCOUNTER — Other Ambulatory Visit: Payer: Self-pay | Admitting: Gastroenterology

## 2023-02-22 ENCOUNTER — Other Ambulatory Visit: Payer: Self-pay | Admitting: Family Medicine

## 2023-03-07 ENCOUNTER — Other Ambulatory Visit: Payer: Self-pay | Admitting: Gastroenterology

## 2023-05-22 ENCOUNTER — Other Ambulatory Visit: Payer: Self-pay | Admitting: Family Medicine

## 2023-06-04 ENCOUNTER — Other Ambulatory Visit: Payer: Self-pay | Admitting: Gastroenterology

## 2023-06-06 ENCOUNTER — Telehealth: Payer: Self-pay

## 2023-06-06 ENCOUNTER — Other Ambulatory Visit: Payer: Self-pay

## 2023-06-06 DIAGNOSIS — K8689 Other specified diseases of pancreas: Secondary | ICD-10-CM

## 2023-06-06 DIAGNOSIS — R933 Abnormal findings on diagnostic imaging of other parts of digestive tract: Secondary | ICD-10-CM

## 2023-06-06 DIAGNOSIS — K861 Other chronic pancreatitis: Secondary | ICD-10-CM

## 2023-06-06 NOTE — Telephone Encounter (Signed)
Order entered, rad scheduling to contact pt regarding appt. Letter also mailed to pt. Lab order in epic.

## 2023-06-06 NOTE — Telephone Encounter (Signed)
-----   Message from Nurse Kerrie Buffalo sent at 05/23/2022  8:54 AM EST ----- Regarding: MR abd MRCP and CA 19-9 Pt needs repeat MR/MRCP and lab

## 2023-06-20 ENCOUNTER — Other Ambulatory Visit: Payer: BLUE CROSS/BLUE SHIELD

## 2023-06-20 DIAGNOSIS — K8689 Other specified diseases of pancreas: Secondary | ICD-10-CM | POA: Diagnosis not present

## 2023-06-20 DIAGNOSIS — R933 Abnormal findings on diagnostic imaging of other parts of digestive tract: Secondary | ICD-10-CM | POA: Diagnosis not present

## 2023-06-20 DIAGNOSIS — K861 Other chronic pancreatitis: Secondary | ICD-10-CM

## 2023-06-21 LAB — CANCER ANTIGEN 19-9: CA 19-9: 51 U/mL — ABNORMAL HIGH (ref <34)

## 2023-06-25 ENCOUNTER — Encounter (HOSPITAL_COMMUNITY): Payer: Self-pay

## 2023-06-25 ENCOUNTER — Other Ambulatory Visit: Payer: Self-pay | Admitting: Gastroenterology

## 2023-06-25 ENCOUNTER — Ambulatory Visit (HOSPITAL_COMMUNITY)
Admission: RE | Admit: 2023-06-25 | Discharge: 2023-06-25 | Disposition: A | Discharge status: Home / Self Care | Payer: Medicaid Other | Source: Ambulatory Visit | Attending: Gastroenterology | Admitting: Gastroenterology

## 2023-06-25 DIAGNOSIS — K861 Other chronic pancreatitis: Secondary | ICD-10-CM

## 2023-06-25 DIAGNOSIS — R933 Abnormal findings on diagnostic imaging of other parts of digestive tract: Secondary | ICD-10-CM

## 2023-06-25 DIAGNOSIS — K8689 Other specified diseases of pancreas: Secondary | ICD-10-CM

## 2023-06-30 ENCOUNTER — Ambulatory Visit (HOSPITAL_COMMUNITY)
Admission: RE | Admit: 2023-06-30 | Discharge: 2023-06-30 | Disposition: A | Discharge status: Home / Self Care | Payer: Medicaid Other | Source: Ambulatory Visit | Attending: Gastroenterology | Admitting: Gastroenterology

## 2023-06-30 DIAGNOSIS — K8689 Other specified diseases of pancreas: Secondary | ICD-10-CM | POA: Insufficient documentation

## 2023-06-30 DIAGNOSIS — R933 Abnormal findings on diagnostic imaging of other parts of digestive tract: Secondary | ICD-10-CM | POA: Insufficient documentation

## 2023-06-30 DIAGNOSIS — K861 Other chronic pancreatitis: Secondary | ICD-10-CM | POA: Diagnosis not present

## 2023-06-30 MED ORDER — GADOBUTROL 1 MMOL/ML IV SOLN
4.0000 mL | Freq: Once | INTRAVENOUS | Status: AC | PRN
  Administered 2023-06-30: 4 mL via INTRAVENOUS

## 2023-07-02 ENCOUNTER — Other Ambulatory Visit: Payer: Self-pay | Admitting: Gastroenterology

## 2023-07-02 DIAGNOSIS — R933 Abnormal findings on diagnostic imaging of other parts of digestive tract: Secondary | ICD-10-CM

## 2023-07-02 DIAGNOSIS — K8689 Other specified diseases of pancreas: Secondary | ICD-10-CM

## 2023-07-02 DIAGNOSIS — K861 Other chronic pancreatitis: Secondary | ICD-10-CM

## 2023-07-25 NOTE — Progress Notes (Signed)
 Jack Moss,  Can you please let Jack Moss know that his MRI showed some subtle changes over the past year to include slight increase in size of the pancreatic duct.  His CA19-9 level has increased slightly as well.  Based on this, as well as his previous EUS findings, I discussed the case with Dr. Meridee Score who agreed that a repeat EUS with FNA would be indicated to further exclude pancreatic neoplasia. If he is agreeable, please proceed with setting up a repeat EUS with Dr. Meridee Score.

## 2023-07-27 ENCOUNTER — Other Ambulatory Visit: Payer: Self-pay

## 2023-07-27 DIAGNOSIS — R933 Abnormal findings on diagnostic imaging of other parts of digestive tract: Secondary | ICD-10-CM

## 2023-07-27 DIAGNOSIS — K861 Other chronic pancreatitis: Secondary | ICD-10-CM

## 2023-07-27 DIAGNOSIS — K8689 Other specified diseases of pancreas: Secondary | ICD-10-CM

## 2023-07-27 DIAGNOSIS — Q453 Other congenital malformations of pancreas and pancreatic duct: Secondary | ICD-10-CM | POA: Insufficient documentation

## 2023-08-25 ENCOUNTER — Other Ambulatory Visit: Payer: Self-pay | Admitting: Family Medicine

## 2023-09-11 ENCOUNTER — Encounter (HOSPITAL_COMMUNITY): Payer: Self-pay | Admitting: Gastroenterology

## 2023-09-11 NOTE — Progress Notes (Signed)
**  Using Interpretive services.Attempted to obtain medical history for pre op call via telephone, unable to reach at this time. HIPAA compliant voicemail message left requesting return call to pre surgical testing department.

## 2023-09-13 ENCOUNTER — Telehealth: Payer: Self-pay

## 2023-09-13 NOTE — Telephone Encounter (Signed)
 Procedure:ultrasounds Procedure date: 09/17/23 Procedure location: wl Arrival Time: 11am Spoke with the patient Y/N: n I left pt a vm with detail message@1 :48pm Any prep concerns? n  Has the patient obtained the prep from the pharmacy ? n Do you have a care partner and transportation: n Any additional concerns? n

## 2023-09-14 NOTE — Telephone Encounter (Signed)
 PT wife called to confirm appointment for 5/5 at [facility]

## 2023-09-17 ENCOUNTER — Encounter (HOSPITAL_COMMUNITY): Payer: Self-pay | Admitting: Gastroenterology

## 2023-09-17 ENCOUNTER — Other Ambulatory Visit: Payer: Self-pay

## 2023-09-17 ENCOUNTER — Encounter (HOSPITAL_COMMUNITY)
Admission: RE | Disposition: A | Discharge status: Home / Self Care | Payer: Self-pay | Source: Home / Self Care | Attending: Gastroenterology

## 2023-09-17 ENCOUNTER — Ambulatory Visit (HOSPITAL_COMMUNITY): Admitting: Certified Registered Nurse Anesthetist

## 2023-09-17 ENCOUNTER — Ambulatory Visit (HOSPITAL_COMMUNITY)
Admission: RE | Admit: 2023-09-17 | Discharge: 2023-09-17 | Disposition: A | Discharge status: Home / Self Care | Attending: Gastroenterology | Admitting: Gastroenterology

## 2023-09-17 ENCOUNTER — Ambulatory Visit (HOSPITAL_BASED_OUTPATIENT_CLINIC_OR_DEPARTMENT_OTHER): Admitting: Certified Registered Nurse Anesthetist

## 2023-09-17 DIAGNOSIS — K8689 Other specified diseases of pancreas: Secondary | ICD-10-CM

## 2023-09-17 DIAGNOSIS — R978 Other abnormal tumor markers: Secondary | ICD-10-CM

## 2023-09-17 DIAGNOSIS — Q453 Other congenital malformations of pancreas and pancreatic duct: Secondary | ICD-10-CM | POA: Diagnosis present

## 2023-09-17 DIAGNOSIS — I10 Essential (primary) hypertension: Secondary | ICD-10-CM | POA: Insufficient documentation

## 2023-09-17 DIAGNOSIS — K869 Disease of pancreas, unspecified: Secondary | ICD-10-CM

## 2023-09-17 DIAGNOSIS — K861 Other chronic pancreatitis: Secondary | ICD-10-CM

## 2023-09-17 DIAGNOSIS — K828 Other specified diseases of gallbladder: Secondary | ICD-10-CM | POA: Diagnosis not present

## 2023-09-17 DIAGNOSIS — K3189 Other diseases of stomach and duodenum: Secondary | ICD-10-CM | POA: Insufficient documentation

## 2023-09-17 DIAGNOSIS — R933 Abnormal findings on diagnostic imaging of other parts of digestive tract: Secondary | ICD-10-CM

## 2023-09-17 DIAGNOSIS — F17210 Nicotine dependence, cigarettes, uncomplicated: Secondary | ICD-10-CM | POA: Insufficient documentation

## 2023-09-17 DIAGNOSIS — K2950 Unspecified chronic gastritis without bleeding: Secondary | ICD-10-CM | POA: Diagnosis not present

## 2023-09-17 DIAGNOSIS — K31A11 Gastric intestinal metaplasia without dysplasia, involving the antrum: Secondary | ICD-10-CM | POA: Diagnosis not present

## 2023-09-17 DIAGNOSIS — K824 Cholesterolosis of gallbladder: Secondary | ICD-10-CM

## 2023-09-17 DIAGNOSIS — E785 Hyperlipidemia, unspecified: Secondary | ICD-10-CM | POA: Insufficient documentation

## 2023-09-17 DIAGNOSIS — I899 Noninfective disorder of lymphatic vessels and lymph nodes, unspecified: Secondary | ICD-10-CM | POA: Diagnosis not present

## 2023-09-17 DIAGNOSIS — K2970 Gastritis, unspecified, without bleeding: Secondary | ICD-10-CM

## 2023-09-17 HISTORY — PX: ESOPHAGOGASTRODUODENOSCOPY: SHX5428

## 2023-09-17 HISTORY — PX: EUS: SHX5427

## 2023-09-17 SURGERY — ULTRASOUND, UPPER GI TRACT, ENDOSCOPIC
Anesthesia: Monitor Anesthesia Care

## 2023-09-17 MED ORDER — PROPOFOL 10 MG/ML IV BOLUS
INTRAVENOUS | Status: DC | PRN
  Administered 2023-09-17: 40 mg via INTRAVENOUS

## 2023-09-17 MED ORDER — PROPOFOL 1000 MG/100ML IV EMUL
INTRAVENOUS | Status: AC
  Filled 2023-09-17: qty 100

## 2023-09-17 MED ORDER — PANTOPRAZOLE SODIUM 40 MG PO TBEC: 40.0000 mg | DELAYED_RELEASE_TABLET | Freq: Every day | ORAL | 4 refills | Status: AC

## 2023-09-17 MED ORDER — PHENYLEPHRINE 80 MCG/ML (10ML) SYRINGE FOR IV PUSH (FOR BLOOD PRESSURE SUPPORT)
PREFILLED_SYRINGE | INTRAVENOUS | Status: DC | PRN
  Administered 2023-09-17: 120 ug via INTRAVENOUS

## 2023-09-17 MED ORDER — PROPOFOL 500 MG/50ML IV EMUL
INTRAVENOUS | Status: DC | PRN
  Administered 2023-09-17: 125 ug/kg/min via INTRAVENOUS

## 2023-09-17 MED ORDER — CIPROFLOXACIN IN D5W 400 MG/200ML IV SOLN
INTRAVENOUS | Status: AC
  Filled 2023-09-17: qty 200

## 2023-09-17 MED ORDER — SODIUM CHLORIDE 0.9 % IV SOLN: INTRAVENOUS | Status: DC

## 2023-09-17 NOTE — Anesthesia Preprocedure Evaluation (Addendum)
 Anesthesia Evaluation  Patient identified by MRN, date of birth, ID band Patient awake    Reviewed: Allergy & Precautions, NPO status , Patient's Chart, lab work & pertinent test results  Airway Mallampati: III  TM Distance: >3 FB Neck ROM: Limited    Dental  (+) Dental Advisory Given, Poor Dentition, Missing, Loose, Chipped,    Pulmonary Current Smoker   Pulmonary exam normal breath sounds clear to auscultation       Cardiovascular hypertension, Pt. on home beta blockers  Rhythm:Regular Rate:Normal     Neuro/Psych negative neurological ROS  negative psych ROS   GI/Hepatic Neg liver ROS,GERD  Medicated,,  Endo/Other  hyperlipidemia  Renal/GU negative Renal ROS     Musculoskeletal negative musculoskeletal ROS (+)    Abdominal   Peds  Hematology negative hematology ROS (+)   Anesthesia Other Findings   Reproductive/Obstetrics                             Anesthesia Physical Anesthesia Plan  ASA: 3  Anesthesia Plan: MAC   Post-op Pain Management: Minimal or no pain anticipated   Induction: Intravenous  PONV Risk Score and Plan: 1 and Treatment may vary due to age or medical condition, Propofol  infusion and TIVA  Airway Management Planned: Simple Face Mask and Natural Airway  Additional Equipment:   Intra-op Plan:   Post-operative Plan:   Informed Consent: I have reviewed the patients History and Physical, chart, labs and discussed the procedure including the risks, benefits and alternatives for the proposed anesthesia with the patient or authorized representative who has indicated his/her understanding and acceptance.     Dental advisory given  Plan Discussed with: CRNA  Anesthesia Plan Comments:         Anesthesia Quick Evaluation

## 2023-09-17 NOTE — Anesthesia Postprocedure Evaluation (Signed)
 Anesthesia Post Note  Patient: Jack Moss  Procedure(s) Performed: ULTRASOUND, UPPER GI TRACT, ENDOSCOPIC     Patient location during evaluation: PACU Anesthesia Type: MAC Level of consciousness: awake and alert Pain management: pain level controlled Vital Signs Assessment: post-procedure vital signs reviewed and stable Respiratory status: spontaneous breathing Cardiovascular status: stable Anesthetic complications: no   No notable events documented.  Last Vitals:  Vitals:   09/17/23 1320 09/17/23 1330  BP: 115/78 115/71  Pulse: 74 69  Resp: 15 15  Temp:    SpO2: 100% 100%    Last Pain:  Vitals:   09/17/23 1330  TempSrc:   PainSc: 0-No pain                 Gorman Laughter

## 2023-09-17 NOTE — Op Note (Signed)
 [facility] Patient Name: Jack Moss Procedure Date: 09/17/2023 MRN: [id number] Attending MD: [name] , MD, [id number] Date of Birth: May 09, 1961 CSN: [id number] Age: 63 Admit Type: Outpatient Procedure:                Upper EUS Indications:              Dilated pancreatic duct on MRCP Providers:                [name], MD, Marylee Snowball, RN,                            Nohemi Batters, Technician Referring MD:              Medicines:                Monitored Anesthesia Care Complications:            No immediate complications. Estimated Blood Loss:     Estimated blood loss was minimal. Procedure:                Pre-Anesthesia Assessment:                           - Prior to the procedure, a History and Physical                            was performed, and patient medications and                            allergies were reviewed. The patient's tolerance of                            previous anesthesia was also reviewed. The risks                            and benefits of the procedure and the sedation                            options and risks were discussed with the patient.                            All questions were answered, and informed consent                            was obtained. Prior Anticoagulants: The patient has                            taken no anticoagulant or antiplatelet agents. ASA                            Grade Assessment: III - A patient with severe                            systemic disease. After reviewing the risks and  benefits, the patient was deemed in satisfactory                            condition to undergo the procedure.                           After obtaining informed consent, the endoscope was                            passed under direct vision. Throughout the                            procedure, the patient's blood pressure, pulse, and                             oxygen saturations were monitored continuously. The                            [id number]-[id number] ([id number]) Olympus endoscope was introduced                            through the mouth, and advanced to the second part                            of duodenum. The [id number]-[id number] ([id number]) Olympus                            duodenoscope was introduced through the mouth, and                            advanced to the area of papilla. The [id number]                            ([id number]) Olympus linear ultrasound scope was                            introduced through the mouth, and advanced to the                            duodenum for ultrasound examination from the                            stomach and duodenum. The upper EUS was                            accomplished without difficulty. The patient                            tolerated the procedure. Scope In: Scope Out: Findings:      ENDOSCOPIC FINDING: :      No gross lesions were noted in the entire esophagus.      The Z-line was regular.      Patchy granular mucosa was found in the entire examined stomach.       Biopsies were  taken with a cold forceps for histology and Helicobacter       pylori testing.      No gross lesions were noted in the duodenal bulb, in the first portion       of the duodenum and in the second portion of the duodenum.      The major papilla was normal (hidden under a hood) -no overt evidence of       fishmouth deformity.      ENDOSONOGRAPHIC FINDING: :      Endosonographic imaging of the pancreas showed sonographic changes       indicative of moderate chronic pancreatitis in the entire pancreas. The       parenchyma had lobularity with honeycombing and hyperechoic strands. The       pancreatic duct had irregularity of contour, dilated side-branches, duct       dilation and a hyperechoic duct margin. The pancreatic duct measured 1.4       -> 4.4 mm within the head of pancreas, the pancreatic duct measured 4.4       mm with  sidebranch duct dilation within the neck of pancreas, the       pancreatic duct measured 5.6 mm within the body of the pancreas, the       pancreatic duct measured 3.8 mm -> 2.2 mm within the tail of the       pancreas.      There was no sign of significant endosonographic abnormality in the       common bile duct (4.4 mm) and in the common hepatic duct (5.0 mm).      2 hypoechoic polyps (less than 4 mm in size) were identified       endosonographically in the gallbladder.      Endosonographic imaging of the ampulla showed no intramural       (subepithelial) lesion.      Endosonographic imaging in the visualized portion of the liver showed no       mass.      No malignant-appearing lymph nodes were visualized in the celiac region       (level 20), peripancreatic region and porta hepatis region.      The celiac region was visualized. Impression:               EGD impression:                           - No gross lesions in the entire esophagus. Z-line                            regular.                           - Granular gastric mucosa. Biopsied.                           - No gross lesions in the duodenal bulb, in the                            first portion of the duodenum and in the second                            portion of the duodenum.                           -  Normal major papilla (hidden under a hood) - no                            overt evidence of fishmouth deformity.                           EUS impression:                           - Endosonographic imaging of the pancreas                            parenchyma and pancreatic duct showed sonographic                            changes consistent with moderate chronic                            pancreatitis.                           - There was no sign of significant pathology in the                            common bile duct and in the common hepatic duct.                           - Two small less than 4 mm polyps  were found in the                            gallbladder.                           - No malignant-appearing lymph nodes were                            visualized in the celiac region (level 20),                            peripancreatic region and porta hepatis region. Moderate Sedation:      Not Applicable - Patient had care per Anesthesia. Recommendation:           - The patient will be observed post-procedure,                            until all discharge criteria are met.                           - Discharge patient to home.                           - Patient has a contact number available for                            emergencies. The signs and symptoms of potential  delayed complications were discussed with the                            patient. Return to normal activities tomorrow.                            Written discharge instructions were provided to the                            patient.                           - Resume previous diet.                           - Observe patient's clinical course.                           - Will discuss case at multidisciplinary conference                            in the coming weeks. Query possible                            pancreaticobiliary surgery referral. Not overtly                            consistent with main duct?IPMN, but could be. PD                            small for PD sampling at this time but could                            consider if patient were to be considered for                            potential resection needs.                           - Will likely monitor with repeat imaging in 3-6                            months with repeat CA 19-9.                           - The findings and recommendations were discussed                            with the patient.                           - The findings and recommendations were discussed                            with the  patient's family. Procedure Code(s):        ---  Professional ---                           [phone], Esophagogastroduodenoscopy, flexible,                            transoral; with endoscopic ultrasound examination                            limited to the esophagus, stomach or duodenum, and                            adjacent structures                           43239, Esophagogastroduodenoscopy, flexible,                            transoral; with biopsy, single or multiple Diagnosis Code(s):        --- Professional ---                           K31.89, Other diseases of stomach and duodenum                           K82.8, Other specified diseases of gallbladder                           I89.9, Noninfective disorder of lymphatic vessels                            and lymph nodes, unspecified                           K86.89, Other specified diseases of pancreas                           R93.3, Abnormal findings on diagnostic imaging of                            other parts of digestive tract CPT copyright 2022 American [employer] Association. All rights reserved. The codes documented in this report are preliminary and upon coder review may  be revised to meet current compliance requirements. [name], MD 09/17/2023 1:16:28 PM Number of Addenda: 0

## 2023-09-17 NOTE — Anesthesia Procedure Notes (Signed)
 Procedure Name: MAC Date/Time: 09/17/2023 12:07 PM  Performed by: Rochell Chroman, CRNAPre-anesthesia Checklist: Patient identified, Emergency Drugs available, Suction available and Patient being monitored Patient Re-evaluated:Patient Re-evaluated prior to induction Oxygen Delivery Method: Nasal cannula

## 2023-09-17 NOTE — H&P (Signed)
 GASTROENTEROLOGY PROCEDURE H&P NOTE   Primary Care Physician: Dema Filler, MD  HPI: Jack Moss is a 63 y.o. male who presents for EGD/EUS of a pancreatic ductal dilation progression and elevated CA19-9.  Past Medical History:  Diagnosis Date   GERD (gastroesophageal reflux disease)    Hemorrhoids    Hyperlipidemia    Past Surgical History:  Procedure Laterality Date   ESOPHAGOGASTRODUODENOSCOPY (EGD) WITH PROPOFOL  N/A 06/02/2021   Procedure: ESOPHAGOGASTRODUODENOSCOPY (EGD) WITH PROPOFOL ;  Surgeon: Brice Campi Albino Alu., MD;  Location[facility] ENDOSCOPY;  Service: Endoscopy;  Laterality: N/A;   UPPER ESOPHAGEAL ENDOSCOPIC ULTRASOUND (EUS) N/A 06/02/2021   Procedure: UPPER ESOPHAGEAL ENDOSCOPIC ULTRASOUND (EUS);  Surgeon: Brice Campi Albino Alu., MD;  Location: [facility] ENDOSCOPY;  Service: Endoscopy;  Laterality: N/A;   No current facility-administered medications for this encounter.   No current facility-administered medications for this encounter. No Known Allergies Family History  Problem Relation Age of Onset   Colon cancer Neg Hx    Esophageal cancer Neg Hx    Rectal cancer Neg Hx    Stomach cancer Neg Hx    Social History   Socioeconomic History   Marital status: Married    Spouse name: Not on file   Number of children: Not on file   Years of education: Not on file   Highest education level: Not on file  Occupational History   Not on file  Tobacco Use   Smoking status: Every Day    Current packs/day: 0.50    Types: Cigarettes   Smokeless tobacco: Never  Vaping Use   Vaping status: Never Used  Substance and Sexual Activity   Alcohol use: Never   Drug use: Never   Sexual activity: Not Currently  Other Topics Concern   Not on file  Social History Narrative   Not on file   Social Drivers of Health   Financial Resource Strain: Not on file  Food Insecurity: Not on file  Transportation Needs: Not on file  Physical Activity: Not on file  Stress: Not on file   Social Connections: Not on file  Intimate Partner Violence: Not on file    Physical Exam: There were no vitals filed for this visit. There is no height or weight on file to calculate BMI. GEN: NAD EYE: Sclerae anicteric ENT: MMM CV: Non-tachycardic GI: Soft, NT/ND NEURO:  Alert & Oriented x 3  Lab Results: No results for input(s): "WBC", "HGB", "HCT", "PLT" in the last 72 hours. BMET No results for input(s): "NA", "K", "CL", "CO2", "GLUCOSE", "BUN", "CREATININE", "CALCIUM " in the last 72 hours. LFT No results for input(s): "PROT", "ALBUMIN", "AST", "ALT", "ALKPHOS", "BILITOT", "BILIDIR", "IBILI" in the last 72 hours. PT/INR No results for input(s): "LABPROT", "INR" in the last 72 hours.   Impression / Plan: This is a 64 y.o.male who presents for EGD/EUS of a pancreatic ductal dilation progression and elevated CA19-9.  The risks of an EUS including intestinal perforation, bleeding, infection, aspiration, and medication effects were discussed as was the possibility it may not give a definitive diagnosis if a biopsy is performed.  When a biopsy of the pancreas is done as part of the EUS, there is an additional risk of pancreatitis at the rate of about 1-2%.  It was explained that procedure related pancreatitis is typically mild, although it can be severe and even life threatening, which is why we do not perform random pancreatic biopsies and only biopsy a lesion/area we feel is concerning enough to warrant the risk.  The risks and benefits of endoscopic evaluation/treatment were discussed with the patient and/or family; these include but are not limited to the risk of perforation, infection, bleeding, missed lesions, lack of diagnosis, severe illness requiring hospitalization, as well as anesthesia and sedation related illnesses.  The patient's history has been reviewed, patient examined, no change in status, and deemed stable for procedure.  The patient and/or family is agreeable to  proceed.    [name], MD [facility] Advanced Endoscopy Office # [phone]

## 2023-09-17 NOTE — Transfer of Care (Signed)
 Immediate Anesthesia Transfer of Care Note  Patient: HARJIT PORTMAN  Procedure(s) Performed: ULTRASOUND, UPPER GI TRACT, ENDOSCOPIC  Patient Location: Endoscopy Unit  Anesthesia Type:MAC  Level of Consciousness: drowsy  Airway & Oxygen Therapy: Patient Spontanous Breathing and Patient connected to nasal cannula oxygen  Post-op Assessment: Report given to RN and Post -op Vital signs reviewed and stable  Post vital signs: Reviewed and stable  Last Vitals:  Vitals Value Taken Time  BP    Temp    Pulse 65 09/17/23 1252  Resp 13 09/17/23 1252  SpO2 99 % 09/17/23 1252  Vitals shown include unfiled device data.  Last Pain:  Vitals:   09/17/23 1155  TempSrc: Temporal  PainSc: 0-No pain         Complications: No notable events documented.

## 2023-09-17 NOTE — Discharge Instructions (Signed)
YOU HAD AN ENDOSCOPIC PROCEDURE TODAY: Refer to the procedure report and other information in the discharge instructions given to you for any specific questions about what was found during the examination. If this information does not answer your questions, please call  office at [phone] to clarify.  ° °YOU SHOULD EXPECT: Some feelings of bloating in the abdomen. Passage of more gas than usual. Walking can help get rid of the air that was put into your GI tract during the procedure and reduce the bloating. If you had a lower endoscopy (such as a colonoscopy or flexible sigmoidoscopy) you may notice spotting of blood in your stool or on the toilet paper. Some abdominal soreness may be present for a day or two, also. ° °DIET: Your first meal following the procedure should be a light meal and then it is ok to progress to your normal diet. A half-sandwich or bowl of soup is an example of a good first meal. Heavy or fried foods are harder to digest and may make you feel nauseous or bloated. Drink plenty of fluids but you should avoid alcoholic beverages for 24 hours. If you had a esophageal dilation, please see attached instructions for diet.   ° °ACTIVITY: Your care partner should take you home directly after the procedure. You should plan to take it easy, moving slowly for the rest of the day. You can resume normal activity the day after the procedure however YOU SHOULD NOT DRIVE, use power tools, machinery or perform tasks that involve climbing or major physical exertion for 24 hours (because of the sedation medicines used during the test).  ° °SYMPTOMS TO REPORT IMMEDIATELY: °A gastroenterologist can be reached at any hour. Please call [phone]  for any of the following symptoms:  °Following lower endoscopy (colonoscopy, flexible sigmoidoscopy) °Excessive amounts of blood in the stool  °Significant tenderness, worsening of abdominal pains  °Swelling of the abdomen that is new, acute  °Fever of 100° or  higher  °Following upper endoscopy (EGD, EUS, ERCP, esophageal dilation) °Vomiting of blood or coffee ground material  °New, significant abdominal pain  °New, significant chest pain or pain under the shoulder blades  °Painful or persistently difficult swallowing  °New shortness of breath  °Black, tarry-looking or red, bloody stools ° °FOLLOW UP:  °If any biopsies were taken you will be contacted by phone or by letter within the next 1-3 weeks. Call [phone]  if you have not heard about the biopsies in 3 weeks.  °Please also call with any specific questions about appointments or follow up tests. ° °

## 2023-09-19 ENCOUNTER — Encounter (HOSPITAL_COMMUNITY): Payer: Self-pay | Admitting: Gastroenterology

## 2023-09-19 ENCOUNTER — Encounter: Payer: Self-pay | Admitting: Gastroenterology

## 2023-09-19 LAB — SURGICAL PATHOLOGY

## 2023-10-01 ENCOUNTER — Ambulatory Visit: Admitting: Family Medicine

## 2023-10-01 ENCOUNTER — Encounter: Payer: Self-pay | Admitting: Family Medicine

## 2023-10-01 VITALS — BP 111/71 | HR 70 | Ht 64.0 in | Wt 107.0 lb

## 2023-10-01 DIAGNOSIS — Z1159 Encounter for screening for other viral diseases: Secondary | ICD-10-CM | POA: Diagnosis not present

## 2023-10-01 DIAGNOSIS — Z125 Encounter for screening for malignant neoplasm of prostate: Secondary | ICD-10-CM

## 2023-10-01 DIAGNOSIS — Z114 Encounter for screening for human immunodeficiency virus [HIV]: Secondary | ICD-10-CM | POA: Diagnosis not present

## 2023-10-01 DIAGNOSIS — Z122 Encounter for screening for malignant neoplasm of respiratory organs: Secondary | ICD-10-CM

## 2023-10-01 DIAGNOSIS — E7800 Pure hypercholesterolemia, unspecified: Secondary | ICD-10-CM | POA: Insufficient documentation

## 2023-10-01 DIAGNOSIS — R1011 Right upper quadrant pain: Secondary | ICD-10-CM

## 2023-10-01 DIAGNOSIS — Z0000 Encounter for general adult medical examination without abnormal findings: Secondary | ICD-10-CM | POA: Insufficient documentation

## 2023-10-01 DIAGNOSIS — K861 Other chronic pancreatitis: Secondary | ICD-10-CM | POA: Diagnosis not present

## 2023-10-01 DIAGNOSIS — Z131 Encounter for screening for diabetes mellitus: Secondary | ICD-10-CM | POA: Diagnosis not present

## 2023-10-01 LAB — POCT GLYCOSYLATED HEMOGLOBIN (HGB A1C): Hemoglobin A1C: 5.8 % — AB (ref 4.0–5.6)

## 2023-10-01 NOTE — Assessment & Plan Note (Addendum)
 Question if current abdominal pain is due to his chronic pancreatitis. Reassured by comfortability on exam today without significant abdominal tenderness. MRI abdomen without hepatic/biliary abnormalities but did demonstrate atrophic pancreas. He is at higher risk of acute exacerbation of pancreatitis after ductal dilation recently. Will collect CMP, lipase, hepatic panel today for further evaluation given persistent pain and follow up results.

## 2023-10-01 NOTE — Patient Instructions (Addendum)
 3. [facility] [id number]  [address]  [place] [address]  Se habla espaol  From 18 months to 61 years old  Parent may go with child   4. [facility]  [address]  [place]  [phone]  Need to bring interpreter   5. [facility]  [address]  [place] [address]  [phone]  Has interpreters in common languages  6. [facility]  [address] [place], [place] [address] Phone: [phone]  7. Health Department (two locations)  [facility]: [facility] at [address] [place], [place], [place] [address].For appointments and more information, call [phone].   [facility]: [address], [place], [place] [address]. For appointments and more information, call [phone].

## 2023-10-01 NOTE — Progress Notes (Signed)
    SUBJECTIVE:   Chief compliant/HPI: annual examination. Wife serves as Equities trader.  Jack Moss is a 63 y.o. who presents today for an annual exam.    He recently underwent a pancreatic ductal dilation and feels much better after this.  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  H. pylori was negative.  He continues to have abdominal pain, but this is improved s/p pancreatic ductal dilation. However, the pain persists in the RUQ. No n/v/c/d.   History tabs reviewed and updated.  He has a history of dilated pancreatic duct, gastritis, trigger finger, GERD, HLD, hemorrhoids, history of H. pylori  OBJECTIVE:   BP 111/71   Pulse 70   Ht 5\' 4"  (1.626 m)   Wt 107 lb (48.5 kg)   SpO2 99%   BMI 18.37 kg/m   General: Alert and oriented, in NAD Skin: Warm, dry, and intact; small papule overlying L index finger palmar surface HEENT: NCAT, EOM grossly normal, midline nasal septum, poor dentition Cardiac: RRR, no m/r/g appreciated Respiratory: CTAB, breathing and speaking comfortably on RA Abdominal: Soft, mild tenderness with palpation of RUQ and side, Murphy sign negative, no hepatomegaly appreciated, nondistended, hyperactive bowel sounds Extremities: Moves all extremities grossly equally Neurological: No gross focal deficit Psychiatric: Appropriate mood and affect   ASSESSMENT/PLAN:   Assessment & Plan Chronic pancreatitis, unspecified pancreatitis type (HCC) Question if current abdominal pain is due to his chronic pancreatitis. Reassured by comfortability on exam today without significant abdominal tenderness. MRI abdomen without hepatic/biliary abnormalities but did demonstrate atrophic pancreas. He is at higher risk of acute exacerbation of pancreatitis after ductal dilation recently. Will collect CMP, lipase, hepatic panel today for further evaluation given persistent  pain and follow up results.   Annual Examination  See AVS for age appropriate recommendations.  No feelings of depression.  Blood pressure value is at goal, discussed.   Considered the following screening exams based upon USPSTF recommendations: Diabetes screening: ordered Screening for elevated cholesterol: ordered HIV testing: ordered Hepatitis C: ordered Hepatitis B: ordered Syphilis if at high risk: not high risk Reviewed risk factors for latent tuberculosis and not indicated Colorectal cancer screening: discussed, colonoscopy ordered - he is due after his last one in 2022 due to high-grade dysplastic polyp Lung cancer screening: discussed and ordered due to smoking history since he was a teenager PSA discussed and after engaging in discussion of possible risks, benefits and complications of screening patient elected to proceed.  Dental resources given.  Follow up in 1 year or sooner if indicated.  MyChart Activation: sent text to phone and explained to patient's wife; she will sign up  Genetta Kenning, MD [facility]

## 2023-10-02 LAB — LIPID PANEL
Chol/HDL Ratio: 3 ratio (ref 0.0–5.0)
Cholesterol, Total: 159 mg/dL (ref 100–199)
HDL: 53 mg/dL
LDL Chol Calc (NIH): 93 mg/dL (ref 0–99)
Triglycerides: 68 mg/dL (ref 0–149)
VLDL Cholesterol Cal: 13 mg/dL (ref 5–40)

## 2023-10-02 LAB — HCV AB W REFLEX TO QUANT PCR: HCV Ab: NONREACTIVE

## 2023-10-02 LAB — HEPATITIS A ANTIBODY, TOTAL: hep A Total Ab: POSITIVE — AB

## 2023-10-02 LAB — COMPREHENSIVE METABOLIC PANEL WITH GFR
ALT: 19 IU/L (ref 0–44)
AST: 22 IU/L (ref 0–40)
Albumin: 4.5 g/dL (ref 3.8–4.9)
Alkaline Phosphatase: 51 IU/L (ref 44–121)
BUN/Creatinine Ratio: 17 (ref 10–24)
BUN: 13 mg/dL (ref 8–27)
Bilirubin Total: 0.4 mg/dL (ref 0.0–1.2)
CO2: 24 mmol/L (ref 20–29)
Calcium: 9.3 mg/dL (ref 8.6–10.2)
Chloride: 104 mmol/L (ref 96–106)
Creatinine, Ser: 0.77 mg/dL (ref 0.76–1.27)
Globulin, Total: 2 g/dL (ref 1.5–4.5)
Glucose: 96 mg/dL (ref 70–99)
Potassium: 4.6 mmol/L (ref 3.5–5.2)
Sodium: 140 mmol/L (ref 134–144)
Total Protein: 6.5 g/dL (ref 6.0–8.5)
eGFR: 102 mL/min/{1.73_m2} (ref >59)

## 2023-10-02 LAB — PSA: Prostate Specific Ag, Serum: 1.2 ng/mL (ref 0.0–4.0)

## 2023-10-02 LAB — HIV ANTIBODY (ROUTINE TESTING W REFLEX): HIV Screen 4th Generation wRfx: NONREACTIVE

## 2023-10-02 LAB — HCV INTERPRETATION

## 2023-10-02 LAB — LIPASE: Lipase: 38 U/L (ref 13–78)

## 2023-10-02 LAB — HEPATITIS B CORE ANTIBODY, TOTAL: Hep B Core Total Ab: NEGATIVE

## 2023-10-02 LAB — HEPATITIS B SURFACE ANTIGEN: Hepatitis B Surface Ag: NEGATIVE

## 2023-10-03 ENCOUNTER — Ambulatory Visit: Payer: Self-pay | Admitting: Family Medicine

## 2023-10-15 ENCOUNTER — Ambulatory Visit (HOSPITAL_COMMUNITY)

## 2023-11-27 ENCOUNTER — Other Ambulatory Visit: Payer: Self-pay | Admitting: Family Medicine

## 2024-03-04 ENCOUNTER — Other Ambulatory Visit: Payer: Self-pay

## 2024-03-04 MED ORDER — ROSUVASTATIN CALCIUM 10 MG PO TABS: 10.0000 mg | ORAL_TABLET | Freq: Every day | ORAL | 3 refills | Status: AC
